# Patient Record
Sex: Female | Born: 1955 | Race: White | Hispanic: No | Marital: Married | Smoking: Former smoker
Health system: Southern US, Community
[De-identification: ages and names within clinical notes are randomized; demographics above are authoritative.]

## PROBLEM LIST (undated history)

## (undated) DIAGNOSIS — F419 Anxiety disorder, unspecified: Secondary | ICD-10-CM

## (undated) DIAGNOSIS — F32A Depression, unspecified: Secondary | ICD-10-CM

## (undated) DIAGNOSIS — F329 Major depressive disorder, single episode, unspecified: Secondary | ICD-10-CM

## (undated) DIAGNOSIS — A879 Viral meningitis, unspecified: Secondary | ICD-10-CM

## (undated) HISTORY — DX: Major depressive disorder, single episode, unspecified: F32.9

## (undated) HISTORY — DX: Depression, unspecified: F32.A

## (undated) HISTORY — DX: Viral meningitis, unspecified: A87.9

## (undated) HISTORY — DX: Anxiety disorder, unspecified: F41.9

---

## 1983-01-03 HISTORY — PX: TUBAL LIGATION: SHX77

## 1990-01-02 HISTORY — PX: VEIN LIGATION: SHX2652

## 1997-05-15 ENCOUNTER — Ambulatory Visit (HOSPITAL_BASED_OUTPATIENT_CLINIC_OR_DEPARTMENT_OTHER): Admission: RE | Admit: 1997-05-15 | Discharge: 1997-05-15 | Payer: Self-pay | Admitting: Surgery

## 1998-03-30 ENCOUNTER — Encounter (INDEPENDENT_AMBULATORY_CARE_PROVIDER_SITE_OTHER): Payer: Self-pay | Admitting: Internal Medicine

## 1998-03-30 ENCOUNTER — Other Ambulatory Visit: Admission: RE | Admit: 1998-03-30 | Discharge: 1998-03-30 | Payer: Self-pay | Admitting: Family Medicine

## 1998-03-30 LAB — CONVERTED CEMR LAB: Pap Smear: NORMAL

## 1998-08-19 ENCOUNTER — Ambulatory Visit (HOSPITAL_COMMUNITY): Admission: RE | Admit: 1998-08-19 | Discharge: 1998-08-19 | Payer: Self-pay | Admitting: Obstetrics and Gynecology

## 1998-08-19 ENCOUNTER — Encounter (INDEPENDENT_AMBULATORY_CARE_PROVIDER_SITE_OTHER): Payer: Self-pay | Admitting: Specialist

## 2004-04-05 ENCOUNTER — Ambulatory Visit: Payer: Self-pay | Admitting: Family Medicine

## 2004-04-12 ENCOUNTER — Ambulatory Visit: Payer: Self-pay | Admitting: Family Medicine

## 2004-04-27 ENCOUNTER — Ambulatory Visit: Payer: Self-pay | Admitting: Family Medicine

## 2004-05-11 ENCOUNTER — Ambulatory Visit: Payer: Self-pay | Admitting: Family Medicine

## 2004-06-09 ENCOUNTER — Ambulatory Visit: Payer: Self-pay | Admitting: Family Medicine

## 2004-10-31 ENCOUNTER — Ambulatory Visit: Payer: Self-pay | Admitting: Family Medicine

## 2004-10-31 ENCOUNTER — Encounter: Admission: RE | Admit: 2004-10-31 | Discharge: 2004-10-31 | Payer: Self-pay | Admitting: Family Medicine

## 2004-12-08 ENCOUNTER — Ambulatory Visit: Payer: Self-pay | Admitting: Family Medicine

## 2005-03-28 ENCOUNTER — Ambulatory Visit: Payer: Self-pay | Admitting: Family Medicine

## 2005-04-18 ENCOUNTER — Ambulatory Visit: Payer: Self-pay | Admitting: Family Medicine

## 2005-05-09 ENCOUNTER — Ambulatory Visit: Payer: Self-pay | Admitting: Family Medicine

## 2005-08-10 ENCOUNTER — Ambulatory Visit: Payer: Self-pay | Admitting: Family Medicine

## 2006-02-06 ENCOUNTER — Ambulatory Visit: Payer: Self-pay | Admitting: Family Medicine

## 2006-07-12 ENCOUNTER — Ambulatory Visit: Payer: Self-pay | Admitting: Family Medicine

## 2006-07-12 DIAGNOSIS — B9789 Other viral agents as the cause of diseases classified elsewhere: Secondary | ICD-10-CM

## 2006-07-31 ENCOUNTER — Encounter: Payer: Self-pay | Admitting: Internal Medicine

## 2006-07-31 DIAGNOSIS — F411 Generalized anxiety disorder: Secondary | ICD-10-CM | POA: Insufficient documentation

## 2006-07-31 DIAGNOSIS — G039 Meningitis, unspecified: Secondary | ICD-10-CM | POA: Insufficient documentation

## 2006-07-31 DIAGNOSIS — F329 Major depressive disorder, single episode, unspecified: Secondary | ICD-10-CM

## 2006-11-22 ENCOUNTER — Ambulatory Visit: Payer: Self-pay | Admitting: Family Medicine

## 2006-11-22 DIAGNOSIS — K209 Esophagitis, unspecified without bleeding: Secondary | ICD-10-CM | POA: Insufficient documentation

## 2006-11-22 DIAGNOSIS — R109 Unspecified abdominal pain: Secondary | ICD-10-CM

## 2007-02-14 ENCOUNTER — Telehealth (INDEPENDENT_AMBULATORY_CARE_PROVIDER_SITE_OTHER): Payer: Self-pay | Admitting: Internal Medicine

## 2007-02-20 ENCOUNTER — Ambulatory Visit: Payer: Self-pay | Admitting: Family Medicine

## 2007-02-20 DIAGNOSIS — R635 Abnormal weight gain: Secondary | ICD-10-CM | POA: Insufficient documentation

## 2007-02-21 LAB — CONVERTED CEMR LAB
ALT: 30 units/L (ref 0–35)
AST: 23 units/L (ref 0–37)
Albumin: 4 g/dL (ref 3.5–5.2)
Alkaline Phosphatase: 73 units/L (ref 39–117)
BUN: 15 mg/dL (ref 6–23)
Basophils Absolute: 0 10*3/uL (ref 0.0–0.1)
Basophils Relative: 0.2 % (ref 0.0–1.0)
Bilirubin, Direct: 0.1 mg/dL (ref 0.0–0.3)
CO2: 28 meq/L (ref 19–32)
Calcium: 9 mg/dL (ref 8.4–10.5)
Chloride: 106 meq/L (ref 96–112)
Cholesterol: 270 mg/dL (ref 0–200)
Creatinine, Ser: 1.1 mg/dL (ref 0.4–1.2)
Direct LDL: 185.4 mg/dL
Eosinophils Absolute: 0.2 10*3/uL (ref 0.0–0.6)
Eosinophils Relative: 3.7 % (ref 0.0–5.0)
GFR calc Af Amer: 67 mL/min
GFR calc non Af Amer: 55 mL/min
Glucose, Bld: 120 mg/dL — ABNORMAL HIGH (ref 70–99)
HCT: 39.7 % (ref 36.0–46.0)
HDL: 38 mg/dL — ABNORMAL LOW (ref >39.0)
Hemoglobin: 13.4 g/dL (ref 12.0–15.0)
Lymphocytes Relative: 28.9 % (ref 12.0–46.0)
MCHC: 33.7 g/dL (ref 30.0–36.0)
MCV: 89.7 fL (ref 78.0–100.0)
Monocytes Absolute: 0.4 10*3/uL (ref 0.2–0.7)
Monocytes Relative: 7.8 % (ref 3.0–11.0)
Neutro Abs: 3.2 10*3/uL (ref 1.4–7.7)
Neutrophils Relative %: 59.4 % (ref 43.0–77.0)
Platelets: 248 10*3/uL (ref 150–400)
Potassium: 4.5 meq/L (ref 3.5–5.1)
RBC: 4.43 M/uL (ref 3.87–5.11)
RDW: 12.7 % (ref 11.5–14.6)
Sodium: 140 meq/L (ref 135–145)
TSH: 1.88 microintl units/mL (ref 0.35–5.50)
Total Bilirubin: 0.9 mg/dL (ref 0.3–1.2)
Total CHOL/HDL Ratio: 7.1
Total Protein: 6.9 g/dL (ref 6.0–8.3)
Triglycerides: 231 mg/dL (ref 0–149)
VLDL: 46 mg/dL — ABNORMAL HIGH (ref 0–40)
WBC: 5.3 10*3/uL (ref 4.5–10.5)

## 2007-03-03 LAB — HM COLONOSCOPY

## 2007-03-15 ENCOUNTER — Telehealth: Payer: Self-pay | Admitting: Family Medicine

## 2007-03-19 ENCOUNTER — Ambulatory Visit: Payer: Self-pay | Admitting: Internal Medicine

## 2007-03-21 ENCOUNTER — Ambulatory Visit (HOSPITAL_COMMUNITY): Admission: RE | Admit: 2007-03-21 | Discharge: 2007-03-21 | Payer: Self-pay | Admitting: Internal Medicine

## 2007-03-28 ENCOUNTER — Encounter (INDEPENDENT_AMBULATORY_CARE_PROVIDER_SITE_OTHER): Payer: Self-pay | Admitting: Internal Medicine

## 2007-03-28 ENCOUNTER — Encounter: Payer: Self-pay | Admitting: Internal Medicine

## 2007-03-28 ENCOUNTER — Ambulatory Visit: Payer: Self-pay | Admitting: Internal Medicine

## 2007-03-28 DIAGNOSIS — D126 Benign neoplasm of colon, unspecified: Secondary | ICD-10-CM | POA: Insufficient documentation

## 2007-03-28 DIAGNOSIS — K5730 Diverticulosis of large intestine without perforation or abscess without bleeding: Secondary | ICD-10-CM | POA: Insufficient documentation

## 2007-04-29 ENCOUNTER — Ambulatory Visit (HOSPITAL_COMMUNITY): Admission: RE | Admit: 2007-04-29 | Discharge: 2007-04-30 | Payer: Self-pay | Admitting: General Surgery

## 2007-04-29 ENCOUNTER — Encounter (INDEPENDENT_AMBULATORY_CARE_PROVIDER_SITE_OTHER): Payer: Self-pay | Admitting: General Surgery

## 2007-05-20 ENCOUNTER — Telehealth (INDEPENDENT_AMBULATORY_CARE_PROVIDER_SITE_OTHER): Payer: Self-pay | Admitting: Internal Medicine

## 2007-07-10 ENCOUNTER — Telehealth (INDEPENDENT_AMBULATORY_CARE_PROVIDER_SITE_OTHER): Payer: Self-pay | Admitting: Internal Medicine

## 2007-08-12 ENCOUNTER — Ambulatory Visit: Payer: Self-pay | Admitting: Family Medicine

## 2007-08-12 DIAGNOSIS — E7800 Pure hypercholesterolemia, unspecified: Secondary | ICD-10-CM | POA: Insufficient documentation

## 2007-10-04 ENCOUNTER — Ambulatory Visit: Payer: Self-pay | Admitting: Family Medicine

## 2007-10-10 LAB — CONVERTED CEMR LAB
ALT: 34 units/L (ref 0–35)
AST: 27 units/L (ref 0–37)
Cholesterol: 178 mg/dL (ref 0–200)
HDL: 39.6 mg/dL (ref >39.0)
LDL Cholesterol: 114 mg/dL — ABNORMAL HIGH (ref 0–99)
Total CHOL/HDL Ratio: 4.5
Triglycerides: 123 mg/dL (ref 0–149)
VLDL: 25 mg/dL (ref 0–40)

## 2007-10-17 ENCOUNTER — Telehealth (INDEPENDENT_AMBULATORY_CARE_PROVIDER_SITE_OTHER): Payer: Self-pay | Admitting: Internal Medicine

## 2007-12-17 ENCOUNTER — Encounter (INDEPENDENT_AMBULATORY_CARE_PROVIDER_SITE_OTHER): Payer: Self-pay | Admitting: *Deleted

## 2008-01-15 ENCOUNTER — Telehealth (INDEPENDENT_AMBULATORY_CARE_PROVIDER_SITE_OTHER): Payer: Self-pay | Admitting: Internal Medicine

## 2008-01-16 ENCOUNTER — Telehealth (INDEPENDENT_AMBULATORY_CARE_PROVIDER_SITE_OTHER): Payer: Self-pay | Admitting: Internal Medicine

## 2008-04-07 ENCOUNTER — Encounter (INDEPENDENT_AMBULATORY_CARE_PROVIDER_SITE_OTHER): Payer: Self-pay | Admitting: *Deleted

## 2008-04-17 ENCOUNTER — Telehealth (INDEPENDENT_AMBULATORY_CARE_PROVIDER_SITE_OTHER): Payer: Self-pay | Admitting: Internal Medicine

## 2009-06-03 ENCOUNTER — Ambulatory Visit: Payer: Self-pay | Admitting: Family Medicine

## 2009-06-03 DIAGNOSIS — K5289 Other specified noninfective gastroenteritis and colitis: Secondary | ICD-10-CM

## 2010-01-23 ENCOUNTER — Encounter: Payer: Self-pay | Admitting: Internal Medicine

## 2010-02-01 NOTE — Assessment & Plan Note (Signed)
Summary: ACU FOR VOMITING, CANT KEEP ANYTHING DOWN   Vital Signs:  Patient profile:   55 year old female Weight:      228.75 pounds Temp:     97.8 degrees F oral Pulse rate:   76 / minute Pulse rhythm:   regular BP sitting:   118 / 62  (left arm) Cuff size:   large  Vitals Entered By: Sydell Axon LPN (June 04, 3014 8:52 AM) CC: Vomitting X 2 days, and aches all over   History of Present Illness: Pt feels awful. She can't keep anything in her stomach, hurts all over, diarrhea not often. She has not been travelled at all, no suspoicious food. Husband had been sick all weekend with same sxs and was treated with Zpak for hurting all over with shakes and chills. She has chills and shakes and has nausea on top of her sxs that he has had. Her head hurts significantly with coughing at the base of the neck. He had congestion, she does not have that. She has not been exposed to anything that is suspicious. She has been minimally dizzy but with "crawly" skin.  Problems Prior to Update: 1)  Gastroenteritis  (ICD-558.9) 2)  Colonic Polyps, Benign  (ICD-211.3) 3)  Diverticulosis of Colon  (ICD-562.10) 4)  Pure Hypercholesterolemia  (ICD-272.0) 5)  Weight Gain  (ICD-783.1) 6)  Health Screening  (ICD-V70.0) 7)  Gerd  (ICD-530.1) 8)  Abdominal Pain Other Specified Site  (ICD-789.09) 9)  Meningitis Nos  (ICD-322.9) 10)  Anxiety  (ICD-300.00) 11)  Depression  (ICD-311) 12)  Infection, Viral Nos  (ICD-079.99)  Medications Prior to Update: 1)  Gnp Glucosamine Chondroitin   Tabs (Misc Natural Products) .... Once Daily 2)  Vit C,e,a,b Complex .... Once Daily 3)  Nasacort Aq 55 Mcg/act  Aers (Triamcinolone Acetonide(Nasal)) .... 2 Each Nostril Qd 4)  Zocor 40 Mg  Tabs (Simvastatin) .Marland Kitchen.. 1 Once Daily For Cholesterol By Mouth 5)  Prozac 20 Mg  Caps (Fluoxetine Hcl) .Marland Kitchen.. 1 Once Daily For Anxiety By Mouth 6)  Wellbutrin Xl 300 Mg Xr24h-Tab (Bupropion Hcl) .Marland Kitchen.. 1 Once Daily For Depression By  Mouth  Allergies: No Known Drug Allergies  Physical Exam  General:  WDWn in mild distress from generally feeling poorly and being nauseated. Nontoxic but mildly dry appearing. Head:  normocephalic, atraumatic, and no abnormalities observed.  Sinuses NT. Eyes:  Conjunctiva clear bilaterally and moist. Ears:  External ear exam shows no significant lesions or deformities.  Otoscopic examination reveals clear canals, tympanic membranes are intact bilaterally without bulging, retraction, inflammation or discharge. Hearing is grossly normal bilaterally. Nose:  External nasal examination shows no deformity or inflammation. Nasal mucosa are pink and moist without lesions or exudates. Mouth:  Oral mucosa and oropharynx without lesions or exudates.  Teeth in good repair. MM minimally dry. Neck:  no thyromegaly, no JVD, and no carotid bruits.   Lungs:  normal respiratory effort, no intercostal retractions, no accessory muscle use, and normal breath sounds.   Heart:  normal rate, regular rhythm, and no murmur.     Impression & Recommendations:  Problem # 1:  GASTROENTERITIS (ICD-558.9) Assessment New  Do not think the pt has bronchitis like her husband had. She is acting and examinatioon concurs looks more like viral syndrome affecting her bowels more tha UR system.  Phenergan 50mg  IM now and then every 4 hrs augment with 25mg  today, tomm go to every 6 hrs and then as needed.  Go to clears today  aand tomm if needed. Then adavance to BRAT.  Avoid milk and milk products for 1+ week.  Call if no better. Will check on her. When able, start tyl regularly.  Discussed use of medication and role of diet. Encouraged clear liquids and electrolyte replacement fluids. Instructed to call if any signs of worsening dehydration.   Orders: Promethazine up to 50mg  (J2550) Admin of Therapeutic Inj  intramuscular or subcutaneous (16109)  Complete Medication List: 1)  Vit C,e,a,b Complex  .... Once daily 2)   Nasacort Aq 55 Mcg/act Aers (Triamcinolone acetonide(nasal)) .... 2 each nostril qd 3)  Promethazine Hcl 25 Mg Tabs (Promethazine hcl) .... One tab by mouth every 6 hrs as needed nausea 4)  Promethazine Hcl 25 Mg Supp (Promethazine hcl) .... One supp pr every 6 hrs as needed nausea.  Patient Instructions: 1)  spent with pt. 2)  Establish when able with Dr Para March. Prescriptions: PROMETHAZINE HCL 25 MG SUPP (PROMETHAZINE HCL) one supp pr every 6 hrs as needed nausea.  #5 x 0   Entered and Authorized by:   Shaune Leeks MD   Signed by:   Shaune Leeks MD on 06/03/2009   Method used:   Electronically to        [facility]* (retail)       [address]       [place], [place]  [address]       Ph: [phone]       Fax: [phone]   RxID:   [id number] PROMETHAZINE HCL 25 MG TABS (PROMETHAZINE HCL) one tab by mouth every 6 hrs as needed nausea  #20 x 0   Entered and Authorized by:   Shaune Leeks MD   Signed by:   Shaune Leeks MD on 06/03/2009   Method used:   Electronically to        [facility]* (retail)       [address]       [place], [place]  [address]       Ph: [phone]       Fax: [phone]   RxID:   [id number]   Current Allergies (reviewed today): No known allergies    Medication Administration  Injection # 1:    Medication: Promethazine up to 50mg     Diagnosis: GASTROENTERITIS (ICD-558.9)    Route: IM    Site: LUOQ gluteus    Exp Date: 05/03/2010    Lot #: [id number]    Mfr: NOVAPLUS    Comments: 50 mg given as instructed.    Patient tolerated injection without complications    Given by: Sydell Axon LPN (June 03, 6008 9:40 AM)  Orders Added: 1)  Est. Patient Level IV [93235] 2)  Promethazine up to 50mg  [J2550] 3)  Admin of Therapeutic Inj  intramuscular or subcutaneous [57322]

## 2010-02-11 IMAGING — US US ABDOMEN COMPLETE
1 series · 13 of 25 positions shown · non-contrast
Comparison: Plain film 10/31/2004

CLINICAL DATA: RUQ ABD PAIN NAUSEA;

ABDOMEN ULTRASOUND
TECHNIQUE: Complete abdominal ultrasound examination was performed
including evaluation of the liver, gallbladder, bile ducts,
pancreas, kidneys, spleen, IVC, and abdominal aorta.

[Series 1: unknown · 0.41mm/px · 13 of 64 slices shown]
[im 1/64]
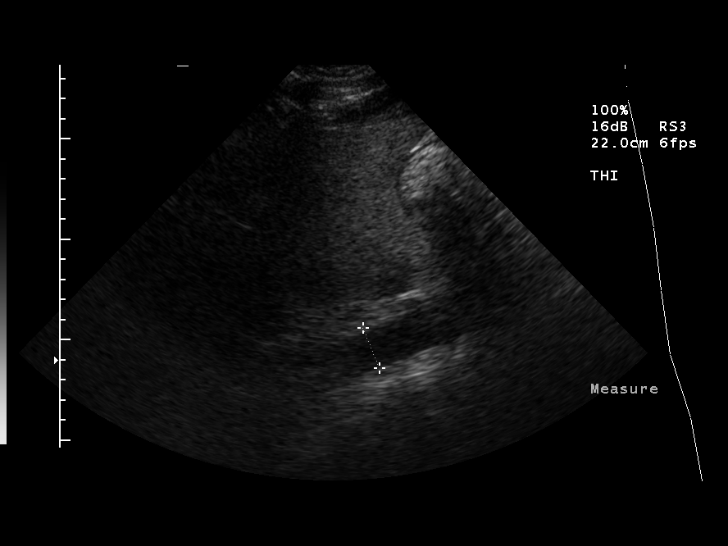
[im 6/64]
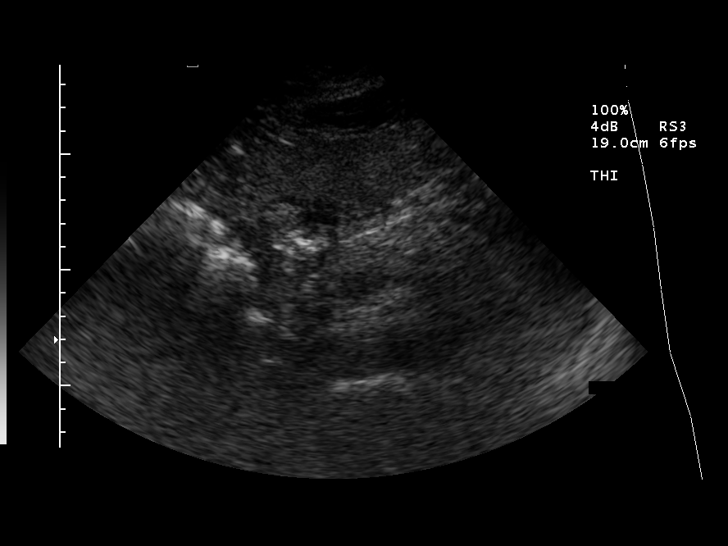
[im 11/64]
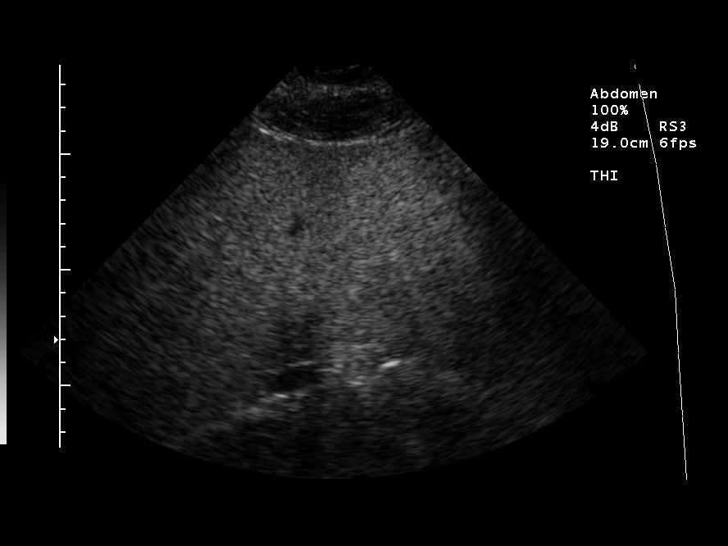
[im 16/64]
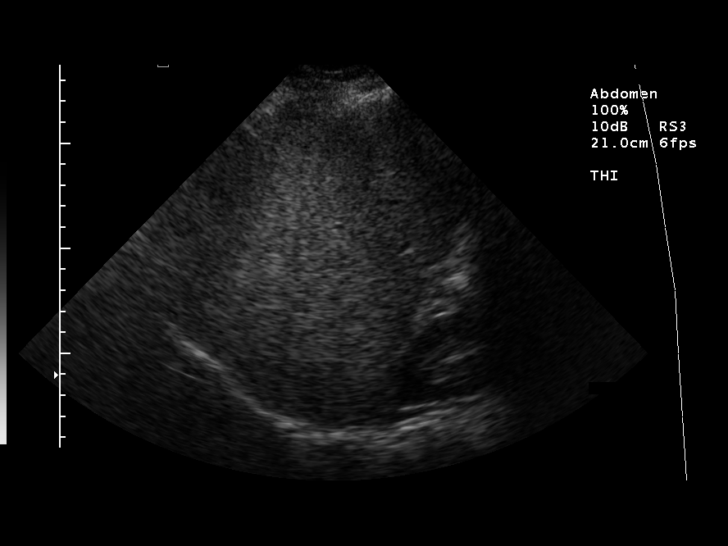
[im 22/64]
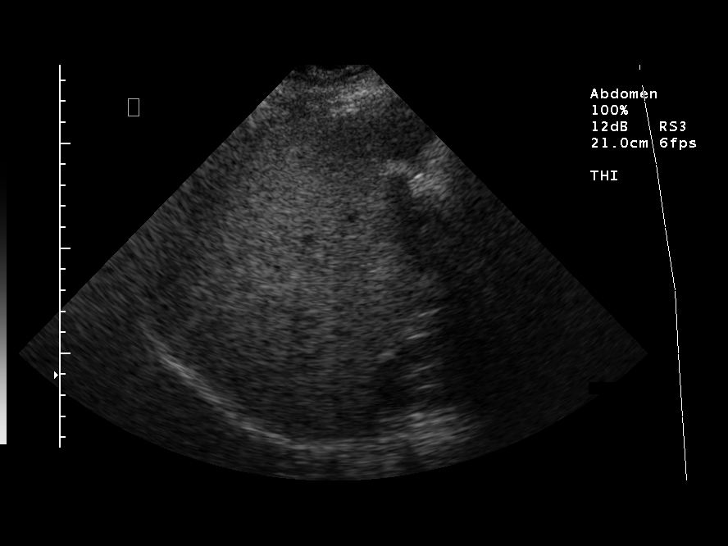
[im 27/64]
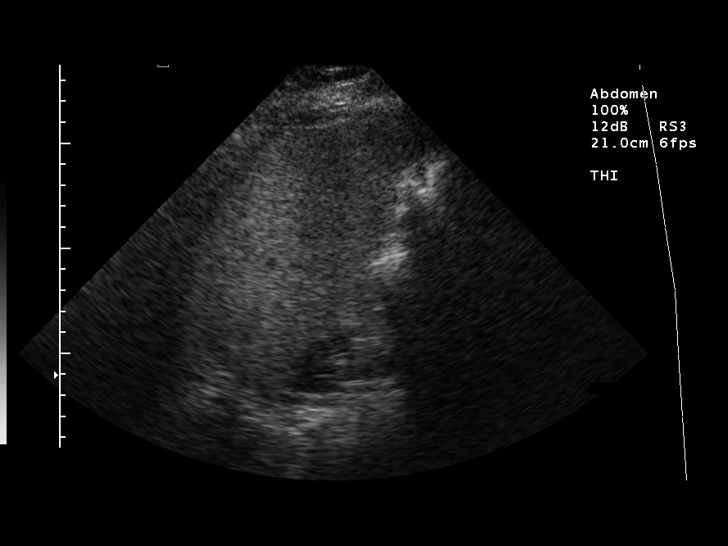
[im 32/64]
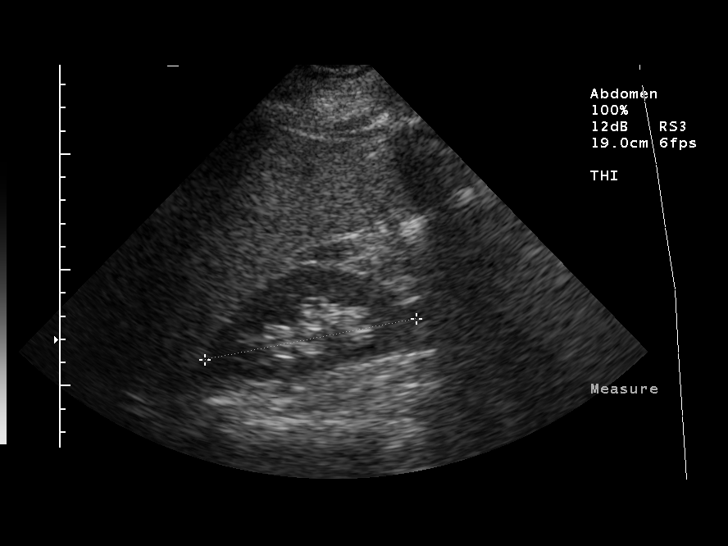
[im 37/64]
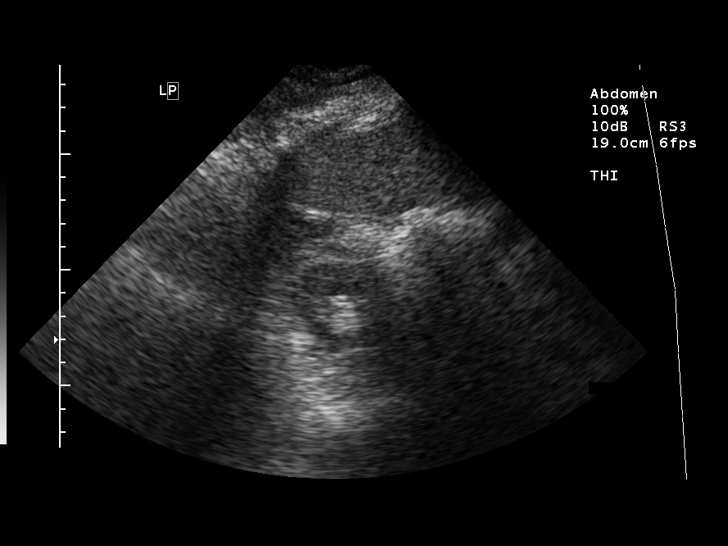
[im 43/64]
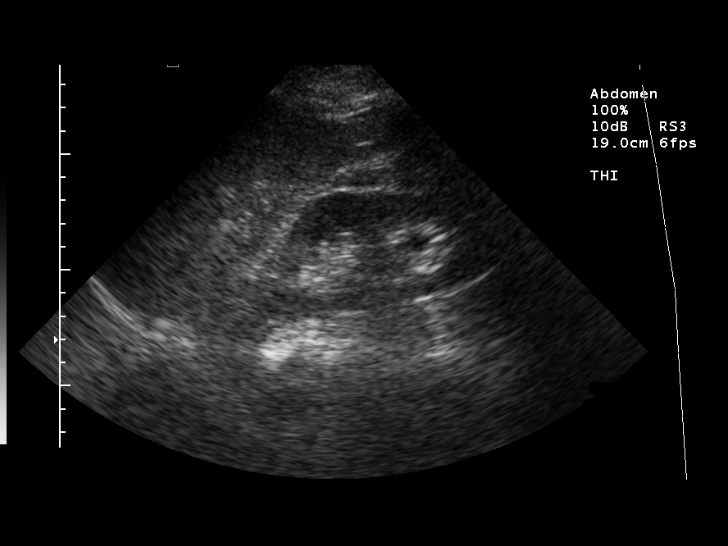
[im 48/64]
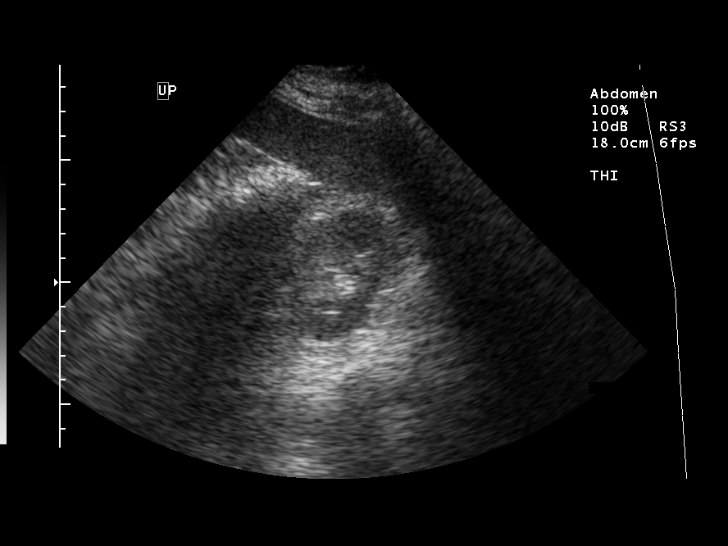
[im 53/64]
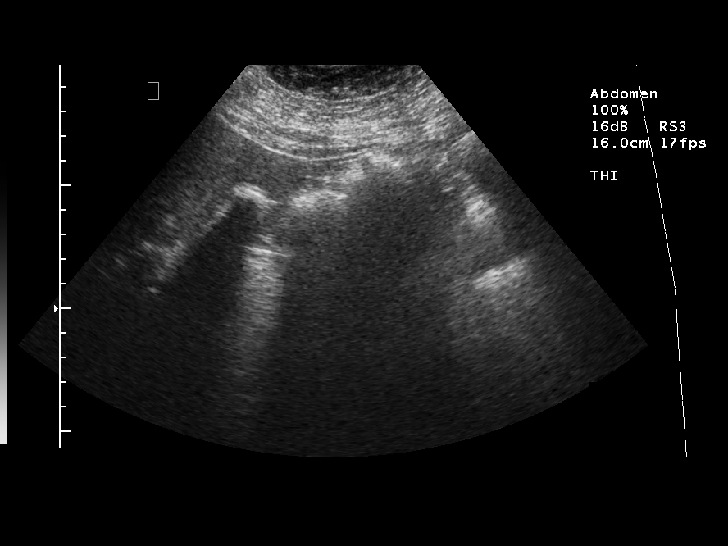
[im 58/64]
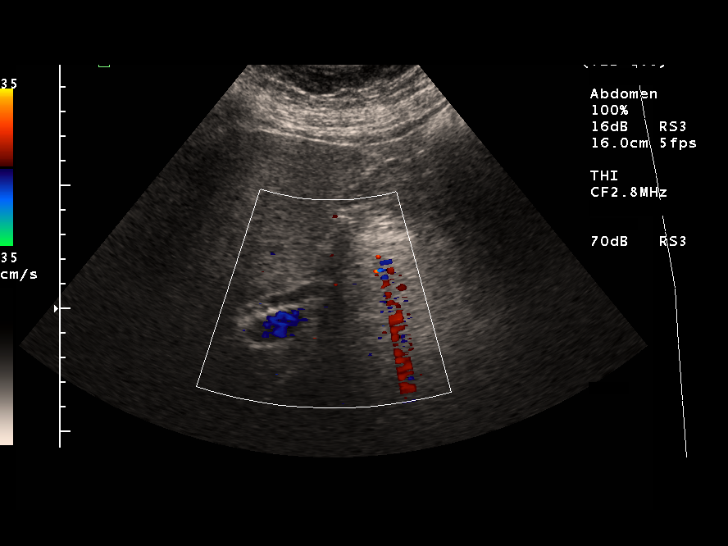
[im 64/64]
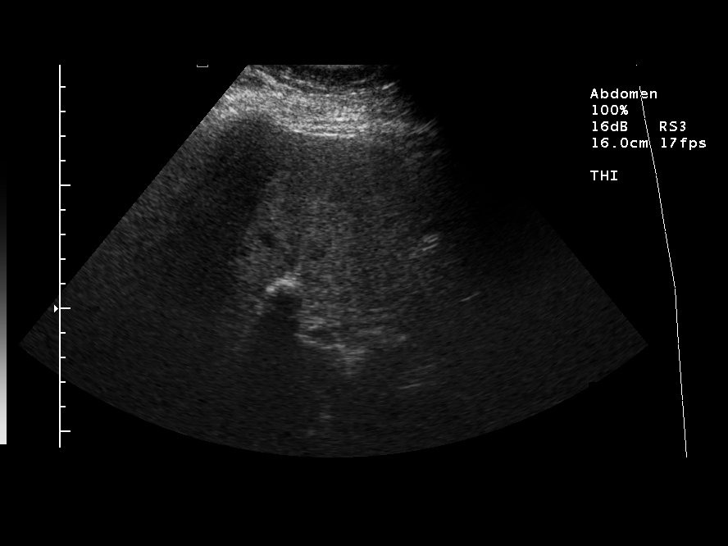

[13 of 25 positions shown; findings below may reference images not displayed]

FINDINGS: Within the gallbladder fossa, there is dense echogenicity
with posterior acoustic shadowing.  Sonographic Murphy's sign was
not elicited.

Common duct is borderline/mildly dilated for age at 8 mm.  No
intrahepatic biliary ductal dilatation.

IVC, pancreas all within normal limits.

Increased echogenicity of the liver most consistent with fatty
infiltration.

Spleen normal in size and echotexture.

Right kidney 9.3 cm and left kidney 10.8 cm.  No hydronephrosis.

Abdominal aorta nonaneurysmal without ascites.]
IMPRESSION: 1.  Echogenic shadowing focus within the gallbladder fossa.  Most
likely a stone filled contracted gallbladder suspicious for chronic
cholecystitis.  Alternatively, calcified gallbladder wall
(porcelain gallbladder) could have this appearance.  If
differentiation is desired, CT may be informative.
2.  Increased echogenicity of the liver most consistent with fatty
infiltration.
3.  Borderline/mild common duct dilatation for age without
intrahepatic biliary ductal dilatation.  Consider correlation with
bilirubin levels.

## 2010-05-17 NOTE — Op Note (Signed)
NAMEJANELE, Alison Chung               ACCOUNT NO.:  [id number]   MEDICAL RECORD NO.:  [id number]          PATIENT TYPE:  OIB   LOCATION:  5127                         FACILITY:  [facility]   PHYSICIAN:  Ollen Gross. Vernell Morgans, M.D. DATE OF BIRTH:  1955-09-19   DATE OF PROCEDURE:  04/29/2007  DATE OF DISCHARGE:                               OPERATIVE REPORT   PREOPERATIVE DIAGNOSIS:  Cholecystitis with cholelithiasis.   POSTOPERATIVE DIAGNOSIS:  Cholecystitis with cholelithiasis.   PROCEDURES:  Laparoscopic cholecystectomy with intraoperative  cholangiogram.   SURGEON:  Ollen Gross. Vernell Morgans, MD   ASSISTANT:  Amber L. Freida Busman, MD   ANESTHESIA:  General endotracheal.   PROCEDURE:  After informed consent was obtained, the patient was brought  to the operating room, placed in a supine position on the table.  After  adequate induction of general anesthesia, the patient's abdomen was  prepped with Betadine and draped in usual sterile manner.  The area  above the umbilicus was infiltrated with 0.25% Marcaine.  A small  incision was made with a 15-blade knife.  This incision was carried down  through the subcutaneous tissue bluntly with a hemostat and [employer]  retractors until the linea was identified.  The linea was incised with a  15-blade knife and each side was grasped with Kocher clamps and elevated  anteriorly.  The preperitoneal space was then probed bluntly with a  hemostat until the peritoneum was opened and access was gained to the  abdominal cavity.  A 0 Vicryl pursestring stitch was placed in the  fascia around the opening.  A Hasson cannula was placed through the  opening and anchored in place at the previous with 0 Vicryl pursestring  stitch.  The abdomen was then insufflated with carbon dioxide without  difficulty.  The patient was placed in a reverse Trendelenburg position  and rotated with the right side up.  The laparoscope was inserted  through the Hasson cannula, and the right upper  quadrant was inspected.  The patient had a fairly large fatty liver.  The epigastric region was  then infiltrated with 0.25% Marcaine.  A small incision was made with a  15-blade knife and a 10-mm port was placed bluntly through this incision  into the abdominal cavity under direct vision.  Sites were then chosen  laterally on the right side of the abdomen with placement of 5-mm ports.  Each of these areas were infiltrated with 0.25% Marcaine.  Small stab  incisions were made with a 15-blade knife and 5-mm ports were placed  bluntly through these incisions into the abdominal cavity under direct  vision.  A blunt grasper was placed through the lateral most 5-mm port  and used to grasp the dome of the gallbladder and elevated anteriorly  and superiorly.  Another blunt grasper was placed through the other 5-mm  port and used to retract on the body and neck of the gallbladder.  Dissector was placed through the epigastric port and using  electrocautery, the peritoneal reflection at the gallbladder neck area  was opened.  Blunt dissection was then carried out in this  area until  the gallbladder neck cystic duct junction was readily identified and a  good window was created.  There was a lot of chronic inflammation in  this area, and this portion of the case took a lot of very meticulous  tedious blunt dissection until the junction of the gallbladder and the  cystic duct was truly identified.  The cystic artery was also identified  right next to the duct, and it was again dissected bluntly in a  circumferential manner until a good window was created.  Two clips  placed proximally and distally on the artery, and artery was divided  into 2.  Next, a clip was placed on the gallbladder on the gallbladder  neck.  A small ductotomy was made just below the clip.  A 14-gauge  Angiocath was then placed percutaneously through the anterior abdominal  wall under direct vision.  A Reddick cholangiogram  catheter was placed  through the Angiocath and flushed.  The Reddick catheter was then placed  within the cystic duct and anchored in place with the clip.  Cholangiogram was obtained that showed no filling defects, good emptying  in the duodenum.  The entire duct system was a bit dilated, but no  stones were identified in the common duct system, and the cystic duct  was relatively short.  At this point, the anchoring clip and catheters  were removed from the patient.  Two clips were placed proximally on the  cystic duct, and the duct was divided between the 2 sets of clips.  Next, a laparoscopic hook cautery device was used to separate the  gallbladder from liver bed.  A small posterior arterial branch was also  identified, dissected bluntly in a circumferential manner until a good  window was created and then a clip was placed across it.  It was also  divided with the electrocautery prior to completely detaching the  gallbladder from the liver bed.  The liver bed was inspected and several  small bleeding points were coagulated with electrocautery until the area  was completely hemostatic.  There was also a couple of small tears of  liver capsule in this just lateral and medial to the liver bed that was  also cauterized and completely hemostatic.  Once this was accomplished,  the gallbladder was then able to be detached away from liver bed without  difficulty with hook electrocautery.  A laparoscopic bag was inserted  through the epigastric port.  The gallbladder was placed in the bag and  the bag was sealed.  The abdomen was irrigated with copious amounts of  saline to the effluent was clear.  Large piece of Surgicel was also  placed along the gallbladder bed of the liver.  The laparoscope was then  moved to the epigastric port.  The gallbladder grasper was placed  through the Hasson cannula used to grasp open the bag.  The bag with the  gallbladder was removed through the supraumbilical  port without  difficulty.  The fascial defect was closed with previously stitched  Vicryl purse-string stitch as well as another figure-of-eight 0 Vicryl  stitch.  The rest of the ports were removed under direct vision and  found to be hemostatic.  The gas was allowed to escape.  The skin  incisions were all closed with interrupted 4-0 Monocryl subcuticular  stitches.  Dermabond dressings were applied.  The patient tolerated the  procedure well.  At the end of the case, all needle, sponge, and  instrument counts  were correct.  The patient was then awakened and taken  to the recovery room in stable condition.      Ollen Gross. Vernell Morgans, M.D.  Electronically Signed     PST/MEDQ  D:  04/29/2007  T:  04/30/2007  Job:  [id number]

## 2010-05-17 NOTE — Assessment & Plan Note (Signed)
[facility]                         GASTROENTEROLOGY OFFICE NOTE   JEZABELLE, CHISOLM                      MRN:          [id number]  DATE:03/19/2007                            DOB:          1955/11/27    REFERRING PHYSICIAN:  Willaim Sheng D. Bean, FNP   REQUESTING Divon Krabill:  Atha Starks. Jillyn Hidden, FNP  Arta Silence, MD)   REASON FOR CONSULTATION:  Right upper quadrant pain.   ASSESSMENT:  A 55 year old white woman who has had some intermittent  right upper quadrant pain, perhaps worse over time.  It is actually  associated with some diarrhea symptoms.  She has known gallstones found  on a lumbar spine film in 2006.  Although these symptoms are atypical, I  do think it is possible that she is having biliary pain.  The diarrhea  is a bit atypical, but again sometimes people have these type of  problems associated with gallbladder disease.  She is tender in her  right upper quadrant and I think chronic cholecystitis is certainly  possible.   RECOMMENDATIONS AND PLAN:  1. Abdominal ultrasound.  2. Surgical consultation with [facility] regarding      cholelithiasis.  3. Schedule colonoscopy to evaluate the diarrhea, plus she is over 50,      but she is having diarrhea and that is the primary cause.   Risks, benefits, and indications of the procedure is explained to the  patient, who understands and agrees to proceed.   HISTORY:  This is a 55 year old white woman who describes some  intermittent, and then more progressive right upper quadrant pain,  somewhat nagging.  It can occur after meals.  There is no infrascapular  pain and the pain is not really severe, but it certainly bothers her.  She has a fairly constant nausea.  She will often have diarrhea after  meals.  This does seem to be progressively worse.  There is no classic  heartburn, indigestion, or dysphagia.  She has not had any rectal  bleeding.  The diarrhea consists of  frequent loose stools 2-3 days out  of the week.   MEDICATIONS:  Wellbutrin, Cymbalta, vitamin B complex, Aleve p.r.n.   ALLERGIES:  THERE ARE NO KNOWN DRUG ALLERGIES.   PAST MEDICAL HISTORY:  Includes depression and anxiety, degenerative  joint disease.   PAST SURGICAL HISTORY:  Tubal ligation and breast surgery.   SOCIAL HISTORY:  The patient is married.  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  She was laid off from [employer] and  [employer] was a Nurse, adult.  Her benefits run out from there at  the end of April.  She is a smoker, social alcohol use.  One son, 1  daughter.   FAMILY HISTORY:  Mother had ovarian cancer.  Father had some sort of  unknown bleeding disorder or problem, though he was an alcoholic and he  had liver disease, so that seems likely to be related to than.   REVIEW OF SYSTEMS:  Eye glasses, night sweats.  Last menstrual period  January 2008.  All other systems  are negative at this time.   PHYSICAL EXAMINATION:  Height 5 feet 9 inches.  Weight 233 pounds.  Blood pressure 120/80.  Pulse 68.  This is an obese, pleasant, white woman in no acute distress.  The eyes  are anicteric.  ENT:  Normal mouth, posterior pharynx.  NECK:  Supple, no thyromegaly or masses.  CHEST:  Clear, resonant.  HEART:  S1 and S2, no murmurs or gallops.  ABDOMEN :  The abdomen has some mild to moderate tenderness in the right  upper quadrant, without an obvious focal Murphy's sign.  The ribs are  not tender.  There is no musculoskeletal tenderness in the abdominal  wall.  The remainder of the abdomen is soft, nontender, without  organomegaly or mass.  Bowel sounds are present.  There are no bruits.  RECTAL:  Deferred until the time of colonoscopy.  LYMPHATIC:  No neck or supraclavicular nodes.  EXTREMITIES:  No peripheral edema.  SKIN:  Warm, dry, no acute rash.  PSYCHIATRIC:  She is alert and oriented x3.  Appropriate affect and  mood.  NEUROLOGIC:   Cranial nerves 2-12 are intact.   I appreciate the opportunity to care for this patient.     Iva Boop, MD,FACG  Electronically Signed    CEG/MedQ  DD: 03/19/2007  DT: 03/20/2007  Job #: [id number]   cc:   Willaim Sheng D. Jillyn Hidden, FNP  [facility]

## 2010-05-17 NOTE — Assessment & Plan Note (Signed)
[facility]                         GASTROENTEROLOGY OFFICE NOTE   JEILY, GUTHRIDGE                      MRN:          [id number]  DATE:03/19/2007                            DOB:          17-Jul-1955    ADDENDUM:  She did have laboratory testing February 20, 2007.  This was  reviewed.  Normal CBC.  Her CMET was normal including LFTs, except for a  glucose of 120.  TSH normal.  Lipids with cholesterol 185, LDL  cholesterol 270, triglycerides 231, HDL 38, VLDL 46.  CBC was normal.     Iva Boop, MD,FACG  Electronically Signed    CEG/MedQ  DD: 03/19/2007  DT: 03/20/2007  Job #: [id number]   cc:   Billie D. Jillyn Hidden, FNP  [facility]

## 2010-09-27 LAB — CBC
HCT: 41.6
Hemoglobin: 14.2
MCHC: 34.2
MCV: 89.1
Platelets: 274
RBC: 4.67
RDW: 13.6
WBC: 7.7

## 2011-10-20 ENCOUNTER — Encounter: Payer: Self-pay | Admitting: Family Medicine

## 2011-10-20 ENCOUNTER — Ambulatory Visit (INDEPENDENT_AMBULATORY_CARE_PROVIDER_SITE_OTHER): Payer: Self-pay | Admitting: Family Medicine

## 2011-10-20 VITALS — BP 102/80 | HR 92 | Temp 97.9°F | Wt 233.0 lb

## 2011-10-20 DIAGNOSIS — K529 Noninfective gastroenteritis and colitis, unspecified: Secondary | ICD-10-CM

## 2011-10-20 DIAGNOSIS — R1110 Vomiting, unspecified: Secondary | ICD-10-CM

## 2011-10-20 DIAGNOSIS — K5289 Other specified noninfective gastroenteritis and colitis: Secondary | ICD-10-CM

## 2011-10-20 MED ORDER — PROMETHAZINE HCL 25 MG PO TABS: 25.0000 mg | ORAL_TABLET | Freq: Four times a day (QID) | ORAL | Status: DC | PRN

## 2011-10-20 MED ORDER — PROMETHAZINE HCL 25 MG/ML IJ SOLN
25.0000 mg | Freq: Once | INTRAMUSCULAR | Status: AC
  Administered 2011-10-20: 25 mg via INTRAMUSCULAR

## 2011-10-20 NOTE — Patient Instructions (Addendum)
Take promethazine 25mg  every 6 hours and sip fluids or take ice chips.  Avoid dairy for about 1 week.  If you stop making urine, then go to the hospital.  Take care.

## 2011-10-20 NOTE — Progress Notes (Signed)
Had a toothache last week, followed by head ache and back ache.  Then she started vomiting over the last 3 days.  Last UOP this AM but less volume and darker than typical.  She gets nauseated with solids or more than a few sips of liquids.  Abdominal wall sore from gagging.  "I ache like I had the flu."  No fevers on temp checks, but had sweats/chills.  No blood in vomit.  Yellow diarrhea recently.   Meds, vitals, and allergies reviewed.   ROS: See HPI.  Otherwise, noncontributory.  nad but looks like she doesn't feel well ncat  Tm wnl Nasal exam wnl OP wnl except for mildly dry membranes Neck supple, no LA rrr ctab abd soft, not ttp, normal BS Ext w/o edema; well perfused.

## 2011-10-21 DIAGNOSIS — K529 Noninfective gastroenteritis and colitis, unspecified: Secondary | ICD-10-CM | POA: Insufficient documentation

## 2011-10-21 NOTE — Assessment & Plan Note (Signed)
Likely viral AGE, nontoxic but will need to inc fluids as tolerated.  Given phenergan IM here in clinic with rx for oral phenergan.  Should be able to tolerate this and inc PO fluids as outpatient.  Avoid dairy.  Doesn't appear to need IVF but if UOP drops off, will need ER eval.  She agrees.  Has husband to help her at home.  Okay for outpatient f/u.

## 2014-02-19 ENCOUNTER — Ambulatory Visit (INDEPENDENT_AMBULATORY_CARE_PROVIDER_SITE_OTHER): Payer: BLUE CROSS/BLUE SHIELD | Admitting: Family Medicine

## 2014-02-19 ENCOUNTER — Encounter: Payer: Self-pay | Admitting: Family Medicine

## 2014-02-19 VITALS — BP 122/82 | HR 83 | Temp 98.4°F | Wt 227.5 lb

## 2014-02-19 DIAGNOSIS — H00019 Hordeolum externum unspecified eye, unspecified eyelid: Secondary | ICD-10-CM | POA: Insufficient documentation

## 2014-02-19 DIAGNOSIS — H00013 Hordeolum externum right eye, unspecified eyelid: Secondary | ICD-10-CM

## 2014-02-19 DIAGNOSIS — L719 Rosacea, unspecified: Secondary | ICD-10-CM

## 2014-02-19 MED ORDER — METRONIDAZOLE 1 % EX GEL: Freq: Every day | CUTANEOUS | Status: DC

## 2014-02-19 NOTE — Assessment & Plan Note (Signed)
Much improved today per patient.  Continue topical tx with warm compresses.  Should resolve, likely incidental to the other lesions that are likely rosacea.

## 2014-02-19 NOTE — Patient Instructions (Signed)
Use the gel on your face but not in your eyes.   Keep using warm compresses for the stye.   If not better, we can refer you to the eye clinic.  Take care.

## 2014-02-19 NOTE — Progress Notes (Signed)
Pre visit review using our clinic review tool, if applicable. No additional management support is needed unless otherwise documented below in the visit note.  She is working to stop smoking.  She is down to <1/2 PPD.   R eyelid lesion.  Stye started about 2 weeks ago.  She had some crusting at the time.  The stye improved, but then she had more upper eyelid swelling again.  Using warm compresses.  No lower eyelid swelling.  No primary eye sx.  Her R upper eyelid is much improved with warm compresses.    She has FH rosacea, mother in later years.  If patient uses facial cream, it makes her face break out.  She again had a facial break out, but w/o a clear trigger.    Meds, vitals, and allergies reviewed.   ROS: See HPI.  Otherwise, noncontributory.  nad ncat Tm wnl nasal and OP exam wnl Mmm PERRL, EOMI, no FB in the eyes. Conjunctiva wnl B Lids wnl except for small stye on R upper eyelid, not ttp Small papules noted on the B face, inferior to B eyes, just inferior to eyebrows

## 2014-02-19 NOTE — Assessment & Plan Note (Signed)
likely rosacea, d/w pt.  Would try topical metrogel and call back as needed.  She agrees.

## 2014-02-20 ENCOUNTER — Telehealth: Payer: Self-pay | Admitting: *Deleted

## 2014-02-20 ENCOUNTER — Telehealth: Payer: Self-pay | Admitting: Family Medicine

## 2014-02-20 MED ORDER — DOXYCYCLINE HYCLATE 100 MG PO TABS: 100.0000 mg | ORAL_TABLET | Freq: Every day | ORAL | Status: DC

## 2014-02-20 NOTE — Telephone Encounter (Signed)
Left detailed message on voicemail at home.

## 2014-02-20 NOTE — Telephone Encounter (Signed)
emmi emailed °

## 2014-02-20 NOTE — Telephone Encounter (Signed)
Can try doxy once a day, short course to try.  rx sent.  Update [place] after she has tried the medicine.  Metronidazole wasn't always that expensive.  I wasn't expecting that price.  Thanks.

## 2014-02-20 NOTE — Telephone Encounter (Signed)
Faxed Correspondence from [facility].  Patient was prescribed Metronidazole.  It is over $300 on her insurance.  Is there a lower cost alternative you could prescribe?

## 2014-04-09 ENCOUNTER — Emergency Department (HOSPITAL_COMMUNITY): Payer: BLUE CROSS/BLUE SHIELD

## 2014-04-09 ENCOUNTER — Encounter (HOSPITAL_COMMUNITY): Payer: Self-pay | Admitting: Emergency Medicine

## 2014-04-09 ENCOUNTER — Emergency Department (HOSPITAL_COMMUNITY)
Admission: EM | Admit: 2014-04-09 | Discharge: 2014-04-09 | Discharge status: Against Medical Advice | Payer: BLUE CROSS/BLUE SHIELD | Attending: Emergency Medicine | Admitting: Emergency Medicine

## 2014-04-09 DIAGNOSIS — Z7982 Long term (current) use of aspirin: Secondary | ICD-10-CM | POA: Diagnosis not present

## 2014-04-09 DIAGNOSIS — Z791 Long term (current) use of non-steroidal anti-inflammatories (NSAID): Secondary | ICD-10-CM | POA: Diagnosis not present

## 2014-04-09 DIAGNOSIS — Z8619 Personal history of other infectious and parasitic diseases: Secondary | ICD-10-CM | POA: Diagnosis not present

## 2014-04-09 DIAGNOSIS — Z792 Long term (current) use of antibiotics: Secondary | ICD-10-CM | POA: Insufficient documentation

## 2014-04-09 DIAGNOSIS — Z720 Tobacco use: Secondary | ICD-10-CM | POA: Insufficient documentation

## 2014-04-09 DIAGNOSIS — R079 Chest pain, unspecified: Secondary | ICD-10-CM | POA: Insufficient documentation

## 2014-04-09 DIAGNOSIS — R55 Syncope and collapse: Secondary | ICD-10-CM | POA: Diagnosis present

## 2014-04-09 DIAGNOSIS — R0602 Shortness of breath: Secondary | ICD-10-CM | POA: Diagnosis not present

## 2014-04-09 DIAGNOSIS — Z8659 Personal history of other mental and behavioral disorders: Secondary | ICD-10-CM | POA: Insufficient documentation

## 2014-04-09 LAB — BASIC METABOLIC PANEL
ANION GAP: 14 (ref 5–15)
BUN: 15 mg/dL (ref 6–23)
CO2: 20 mmol/L (ref 19–32)
Calcium: 9.1 mg/dL (ref 8.4–10.5)
Chloride: 107 mmol/L (ref 96–112)
Creatinine, Ser: 1.02 mg/dL (ref 0.50–1.10)
GFR calc Af Amer: 68 mL/min — ABNORMAL LOW (ref >90)
GFR calc non Af Amer: 59 mL/min — ABNORMAL LOW (ref >90)
Glucose, Bld: 140 mg/dL — ABNORMAL HIGH (ref 70–99)
Potassium: 3.5 mmol/L (ref 3.5–5.1)
Sodium: 141 mmol/L (ref 135–145)

## 2014-04-09 LAB — CBC WITH DIFFERENTIAL/PLATELET
Basophils Absolute: 0 10*3/uL (ref 0.0–0.1)
Basophils Relative: 0 % (ref 0–1)
Eosinophils Absolute: 0.1 10*3/uL (ref 0.0–0.7)
Eosinophils Relative: 0 % (ref 0–5)
HCT: 38.3 % (ref 36.0–46.0)
Hemoglobin: 13.2 g/dL (ref 12.0–15.0)
Lymphocytes Relative: 11 % — ABNORMAL LOW (ref 12–46)
Lymphs Abs: 1.4 10*3/uL (ref 0.7–4.0)
MCH: 30.8 pg (ref 26.0–34.0)
MCHC: 34.5 g/dL (ref 30.0–36.0)
MCV: 89.3 fL (ref 78.0–100.0)
Monocytes Absolute: 0.8 10*3/uL (ref 0.1–1.0)
Monocytes Relative: 7 % (ref 3–12)
Neutro Abs: 10.2 10*3/uL — ABNORMAL HIGH (ref 1.7–7.7)
Neutrophils Relative %: 82 % — ABNORMAL HIGH (ref 43–77)
Platelets: 208 10*3/uL (ref 150–400)
RBC: 4.29 MIL/uL (ref 3.87–5.11)
RDW: 13.2 % (ref 11.5–15.5)
WBC: 12.5 10*3/uL — ABNORMAL HIGH (ref 4.0–10.5)

## 2014-04-09 LAB — URINALYSIS, ROUTINE W REFLEX MICROSCOPIC
Bilirubin Urine: NEGATIVE
Glucose, UA: NEGATIVE mg/dL
Hgb urine dipstick: NEGATIVE
Ketones, ur: NEGATIVE mg/dL
LEUKOCYTES UA: NEGATIVE
Nitrite: NEGATIVE
Protein, ur: NEGATIVE mg/dL
Specific Gravity, Urine: 1.01 (ref 1.005–1.030)
Urobilinogen, UA: 0.2 mg/dL (ref 0.0–1.0)
pH: 6 (ref 5.0–8.0)

## 2014-04-09 LAB — I-STAT CG4 LACTIC ACID, ED: Lactic Acid, Venous: 3.86 mmol/L (ref 0.5–2.0)

## 2014-04-09 LAB — I-STAT TROPONIN, ED: Troponin i, poc: 0 ng/mL (ref 0.00–0.08)

## 2014-04-09 MED ORDER — SODIUM CHLORIDE 0.9 % IV BOLUS (SEPSIS)
1000.0000 mL | Freq: Once | INTRAVENOUS | Status: AC
  Administered 2014-04-09: 1000 mL via INTRAVENOUS

## 2014-04-09 NOTE — Discharge Instructions (Signed)
Chest Pain (Nonspecific) °It is often hard to give a specific diagnosis for the cause of chest pain. There is always a chance that your pain could be related to something serious, such as a heart attack or a blood clot in the lungs. You need to follow up with your health care provider for further evaluation. °CAUSES  °· Heartburn. °· Pneumonia or bronchitis. °· Anxiety or stress. °· Inflammation around your heart (pericarditis) or lung (pleuritis or pleurisy). °· A blood clot in the lung. °· A collapsed lung (pneumothorax). It can develop suddenly on its own (spontaneous pneumothorax) or from trauma to the chest. °· Shingles infection (herpes zoster virus). °The chest wall is composed of bones, muscles, and cartilage. Any of these can be the source of the pain. °· The bones can be bruised by injury. °· The muscles or cartilage can be strained by coughing or overwork. °· The cartilage can be affected by inflammation and become sore (costochondritis). °DIAGNOSIS  °Lab tests or other studies may be needed to find the cause of your pain. Your health care provider may have you take a test called an ambulatory electrocardiogram (ECG). An ECG records your heartbeat patterns over a 24-hour period. You may also have other tests, such as: °· Transthoracic echocardiogram (TTE). During echocardiography, sound waves are used to evaluate how blood flows through your heart. °· Transesophageal echocardiogram (TEE). °· Cardiac monitoring. This allows your health care provider to monitor your heart rate and rhythm in real time. °· Holter monitor. This is a portable device that records your heartbeat and can help diagnose heart arrhythmias. It allows your health care provider to track your heart activity for several days, if needed. °· Stress tests by exercise or by giving medicine that makes the heart beat faster. °TREATMENT  °· Treatment depends on what may be causing your chest pain. Treatment may include: °¨ Acid blockers for  heartburn. °¨ Anti-inflammatory medicine. °¨ Pain medicine for inflammatory conditions. °¨ Antibiotics if an infection is present. °· You may be advised to change lifestyle habits. This includes stopping smoking and avoiding alcohol, caffeine, and chocolate. °· You may be advised to keep your head raised (elevated) when sleeping. This reduces the chance of acid going backward from your stomach into your esophagus. °Most of the time, nonspecific chest pain will improve within 2-3 days with rest and mild pain medicine.  °HOME CARE INSTRUCTIONS  °· If antibiotics were prescribed, take them as directed. Finish them even if you start to feel better. °· For the next few days, avoid physical activities that bring on chest pain. Continue physical activities as directed. °· Do not use any tobacco products, including cigarettes, chewing tobacco, or electronic cigarettes. °· Avoid drinking alcohol. °· Only take medicine as directed by your health care provider. °· Follow your health care provider's suggestions for further testing if your chest pain does not go away. °· Keep any follow-up appointments you made. If you do not go to an appointment, you could develop lasting (chronic) problems with pain. If there is any problem keeping an appointment, call to reschedule. °SEEK MEDICAL CARE IF:  °· Your chest pain does not go away, even after treatment. °· You have a rash with blisters on your chest. °· You have a fever. °SEEK IMMEDIATE MEDICAL CARE IF:  °· You have increased chest pain or pain that spreads to your arm, neck, jaw, back, or abdomen. °· You have shortness of breath. °· You have an increasing cough, or you cough   up blood.  You have severe back or abdominal pain.  You feel nauseous or vomit.  You have severe weakness.  You faint.  You have chills. This is an emergency. Do not wait to see if the pain will go away. Get medical help at once. Call your local emergency services (911 in [place] Do not drive  yourself to the hospital. MAKE SURE YOU:   Understand these instructions.  Will watch your condition.  Will get help right away if you are not doing well or get worse. Document Released: 09/28/2004 Document Revised: 12/24/2012 Document Reviewed: 07/25/2007 [facility] Patient Information 2015 [place], [place]. This information is not intended to replace advice given to you by your health care provider. Make sure you discuss any questions you have with your health care provider.  Near-Syncope Near-syncope (commonly known as near fainting) is sudden weakness, dizziness, or feeling like you might pass out. During an episode of near-syncope, you may also develop pale skin, have tunnel vision, or feel sick to your stomach (nauseous). Near-syncope may occur when getting up after sitting or while standing for a long time. It is caused by a sudden decrease in blood flow to the brain. This decrease can result from various causes or triggers, most of which are not serious. However, because near-syncope can sometimes be a sign of something serious, a medical evaluation is required. The specific cause is often not determined. HOME CARE INSTRUCTIONS  Monitor your condition for any changes. The following actions may help to alleviate any discomfort you are experiencing:  Have someone stay with you until you feel stable.  Lie down right away and prop your feet up if you start feeling like you might faint. Breathe deeply and steadily. Wait until all the symptoms have passed. Most of these episodes last only a few minutes. You may feel tired for several hours.   Drink enough fluids to keep your urine clear or pale yellow.   If you are taking blood pressure or heart medicine, get up slowly when seated or lying down. Take several minutes to sit and then stand. This can reduce dizziness.  Follow up with your health care provider as directed. SEEK IMMEDIATE MEDICAL CARE IF:   You have a severe headache.    You have unusual pain in the chest, abdomen, or back.   You are bleeding from the mouth or rectum, or you have black or tarry stool.   You have an irregular or very fast heartbeat.   You have repeated fainting or have seizure-like jerking during an episode.   You faint when sitting or lying down.   You have confusion.   You have difficulty walking.   You have severe weakness.   You have vision problems.  MAKE SURE YOU:   Understand these instructions.  Will watch your condition.  Will get help right away if you are not doing well or get worse. Document Released: 12/19/2004 Document Revised: 12/24/2012 Document Reviewed: 05/24/2012 [facility] Patient Information 2015 [place], [place]. This information is not intended to replace advice given to you by your health care provider. Make sure you discuss any questions you have with your health care provider.

## 2014-04-09 NOTE — ED Provider Notes (Signed)
CSN: [id number]     Arrival date & time 04/09/14  1704 History   First MD Initiated Contact with Patient 04/09/14 1714     Chief Complaint  Patient presents with  . Loss of Consciousness     (Consider location/radiation/quality/duration/timing/severity/associated sxs/prior Treatment) Patient is a 59 y.o. female presenting with syncope. The history is provided by the patient.  Loss of Consciousness Associated symptoms: chest pain and shortness of breath   Associated symptoms: no headaches, no nausea, no vomiting and no weakness    patient presents after a near syncopal episode. Had been working outside all day and hadn't been eating or drinking. Had only eaten breakfast. Began to feel somewhat lightheaded and was having difficulty standing up. Then became more short of breath began to feel pressure on her chest. States it felt as if someone was sitting on. EMS was called and found to be tachycardic. Not hypoxic. Sugar was 1:30. Patient feels somewhat better after treatment with still feels too weak to get up and walk around. No swelling or legs. No nausea or vomiting. States she drank a little protein drink and had some sugar while she was at home. She no longer has chest pressure.  Past Medical History  Diagnosis Date  . Viral meningitis 7/97    2/86  . Anxiety     at time of mother's death  . Depression     at time of mother's death   Past Surgical History  Procedure Laterality Date  . Tubal ligation  1985  . Vein ligation  1992    Bilateral lower legs   Family History  Problem Relation Age of Onset  . Cancer Mother     ovarian cancer  . Alcohol abuse Father    History  Substance Use Topics  . Smoking status: Current Every Day Smoker -- 0.50 packs/day for 40 years    Types: Cigarettes  . Smokeless tobacco: Not on file  . Alcohol Use: Not on file   OB History    No data available     Review of Systems  Constitutional: Negative for activity change and appetite change.   Eyes: Negative for pain.  Respiratory: Positive for shortness of breath. Negative for chest tightness.   Cardiovascular: Positive for chest pain and syncope. Negative for leg swelling.  Gastrointestinal: Negative for nausea, vomiting, abdominal pain and diarrhea.  Genitourinary: Negative for flank pain.  Musculoskeletal: Negative for back pain and neck stiffness.  Skin: Negative for rash.  Neurological: Positive for light-headedness. Negative for weakness, numbness and headaches.  Psychiatric/Behavioral: Negative for behavioral problems.      Allergies  Review of patient's allergies indicates no known allergies.  Home Medications   Prior to Admission medications   Medication Sig Start Date End Date Taking? Authorizing Provider  aspirin EC 81 MG tablet Take 81 mg by mouth daily.   Yes Historical Provider, MD  B Complex-C (SUPER B COMPLEX PO) Take 1 tablet by mouth daily.   Yes Historical Provider, MD  cholecalciferol (VITAMIN D) 1000 UNITS tablet Take 1,000 Units by mouth daily.   Yes Historical Provider, MD  Coconut Oil OIL Take 1 capsule by mouth daily.    Yes Historical Provider, MD  naproxen sodium (ANAPROX) 220 MG tablet Take 220 mg by mouth 2 (two) times daily as needed (for pain).   Yes Historical Provider, MD  doxycycline (VIBRA-TABS) 100 MG tablet Take 1 tablet (100 mg total) by mouth daily. 02/20/14   Tonia Ghent, MD  BP 118/66 mmHg  Pulse 90  Temp(Src) 97.7 F (36.5 C) (Oral)  Resp 20  Ht 5\' 9"  (1.753 m)  Wt 227 lb (102.967 kg)  BMI 33.51 kg/m2  SpO2 100% Physical Exam  Constitutional: She appears well-developed.  HENT:  Head: Normocephalic.  Eyes: Pupils are equal, round, and reactive to light.  Neck: Neck supple.  Cardiovascular: Normal rate.   Pulmonary/Chest: Effort normal.  Abdominal: Soft. There is no tenderness.  Musculoskeletal: She exhibits no edema.  Neurological: She is alert.  Moves all extremities  Skin: Skin is warm.  Nursing note and  vitals reviewed.   ED Course  Procedures (including critical care time) Labs Review Labs Reviewed  BASIC METABOLIC PANEL - Abnormal; Notable for the following:    Glucose, Bld 140 (*)    GFR calc non Af Amer 59 (*)    GFR calc Af Amer 68 (*)    All other components within normal limits  CBC WITH DIFFERENTIAL/PLATELET - Abnormal; Notable for the following:    WBC 12.5 (*)    Neutrophils Relative % 82 (*)    Neutro Abs 10.2 (*)    Lymphocytes Relative 11 (*)    All other components within normal limits  URINALYSIS, ROUTINE W REFLEX MICROSCOPIC - Abnormal; Notable for the following:    APPearance CLOUDY (*)    All other components within normal limits  I-STAT CG4 LACTIC ACID, ED - Abnormal; Notable for the following:    Lactic Acid, Venous 3.86 (*)    All other components within normal limits  I-STAT TROPOININ, ED    Imaging Review Dg Chest Portable 1 View  04/09/2014   CLINICAL DATA:  Chest pressure and shortness of breath today  EXAM: PORTABLE CHEST - 1 VIEW  COMPARISON:  Portable exam 1732 hours without priors for comparison.  FINDINGS: Normal heart size, mediastinal contours and pulmonary vascularity.  Lungs clear.  No pleural effusion or pneumothorax.  Bones unremarkable.  IMPRESSION: No acute abnormalities.   Electronically Signed   By: Lavonia Dana M.D.   On: 04/09/2014 18:06     EKG Interpretation   Date/Time:  Thursday April 09 2014 17:05:06 EDT Ventricular Rate:  92 PR Interval:  163 QRS Duration: 96 QT Interval:  369 QTC Calculation: 456 R Axis:   67 Text Interpretation:  Sinus rhythm Confirmed by Alvino Chapel  MD, Ovid Curd  [phone]) on 04/09/2014 5:21:09 PM      MDM   Final diagnoses:  Syncope, unspecified syncope type  Chest pain, unspecified chest pain type    Patient with near syncope. Likely due to overworking and dehydration. Feels better after treatment. However patient did have some chest pain that was chest pressure. EKG reassuring but in with the pressure  I requested admission, patient was not willing to stay. She was aware of risks and was discharged against medical advice     Davonna Belling, MD 04/09/14 [phone]

## 2014-04-09 NOTE — ED Notes (Addendum)
Per ems patient was working in the yard all day and collapsed. Husband witnessed collapse. Patient lost consciousness for about 2 minutes. Patient came too and stated shortness of breath, fatigue and weakness with diaphoresis. Patient complained of nausea en route. Patient also complains of Neck and left knee pain from falling during syncopal episode.  Was given 4 mg Zofran IV.  CBG 130 BP 114/55 HR 91 97 %

## 2014-04-09 NOTE — ED Notes (Signed)
Dr. Sebastian Ache and Aaron Edelman King-RN notified of elevated CG4 result.

## 2014-05-15 ENCOUNTER — Telehealth: Payer: Self-pay | Admitting: *Deleted

## 2014-05-15 NOTE — Telephone Encounter (Signed)
Called patient to schedule screening mammogram.  Left message for patient to return my call.

## 2014-08-11 ENCOUNTER — Telehealth: Payer: Self-pay | Admitting: *Deleted

## 2014-08-11 ENCOUNTER — Encounter: Payer: Self-pay | Admitting: Family Medicine

## 2014-08-11 NOTE — Telephone Encounter (Signed)
Left message for patient to call in for assistance scheduling mammogram.

## 2017-03-15 ENCOUNTER — Encounter: Payer: Self-pay | Admitting: Internal Medicine

## 2017-10-06 ENCOUNTER — Emergency Department (HOSPITAL_COMMUNITY): Payer: BLUE CROSS/BLUE SHIELD

## 2017-10-06 ENCOUNTER — Emergency Department (HOSPITAL_COMMUNITY)
Admission: EM | Admit: 2017-10-06 | Discharge: 2017-10-07 | Disposition: A | Discharge status: Home / Self Care | Payer: BLUE CROSS/BLUE SHIELD | Attending: Emergency Medicine | Admitting: Emergency Medicine

## 2017-10-06 ENCOUNTER — Other Ambulatory Visit: Payer: Self-pay

## 2017-10-06 DIAGNOSIS — Z79899 Other long term (current) drug therapy: Secondary | ICD-10-CM | POA: Insufficient documentation

## 2017-10-06 DIAGNOSIS — F17210 Nicotine dependence, cigarettes, uncomplicated: Secondary | ICD-10-CM | POA: Diagnosis not present

## 2017-10-06 DIAGNOSIS — Y9301 Activity, walking, marching and hiking: Secondary | ICD-10-CM | POA: Insufficient documentation

## 2017-10-06 DIAGNOSIS — M542 Cervicalgia: Secondary | ICD-10-CM | POA: Diagnosis not present

## 2017-10-06 DIAGNOSIS — Y999 Unspecified external cause status: Secondary | ICD-10-CM | POA: Insufficient documentation

## 2017-10-06 DIAGNOSIS — Y929 Unspecified place or not applicable: Secondary | ICD-10-CM | POA: Insufficient documentation

## 2017-10-06 DIAGNOSIS — Z23 Encounter for immunization: Secondary | ICD-10-CM | POA: Insufficient documentation

## 2017-10-06 DIAGNOSIS — R064 Hyperventilation: Secondary | ICD-10-CM | POA: Diagnosis not present

## 2017-10-06 DIAGNOSIS — W108XXA Fall (on) (from) other stairs and steps, initial encounter: Secondary | ICD-10-CM | POA: Insufficient documentation

## 2017-10-06 DIAGNOSIS — W19XXXA Unspecified fall, initial encounter: Secondary | ICD-10-CM

## 2017-10-06 DIAGNOSIS — R52 Pain, unspecified: Secondary | ICD-10-CM | POA: Diagnosis not present

## 2017-10-06 DIAGNOSIS — Z7982 Long term (current) use of aspirin: Secondary | ICD-10-CM | POA: Diagnosis not present

## 2017-10-06 DIAGNOSIS — S0990XA Unspecified injury of head, initial encounter: Secondary | ICD-10-CM | POA: Diagnosis not present

## 2017-10-06 DIAGNOSIS — R0902 Hypoxemia: Secondary | ICD-10-CM | POA: Diagnosis not present

## 2017-10-06 DIAGNOSIS — S0181XA Laceration without foreign body of other part of head, initial encounter: Secondary | ICD-10-CM

## 2017-10-06 DIAGNOSIS — R51 Headache: Secondary | ICD-10-CM | POA: Diagnosis not present

## 2017-10-06 DIAGNOSIS — S199XXA Unspecified injury of neck, initial encounter: Secondary | ICD-10-CM | POA: Diagnosis not present

## 2017-10-06 DIAGNOSIS — R58 Hemorrhage, not elsewhere classified: Secondary | ICD-10-CM | POA: Diagnosis not present

## 2017-10-06 MED ORDER — LIDOCAINE-EPINEPHRINE (PF) 2 %-1:200000 IJ SOLN
20.0000 mL | Freq: Once | INTRAMUSCULAR | Status: AC
  Administered 2017-10-06: 20 mL
  Filled 2017-10-06: qty 20

## 2017-10-06 MED ORDER — TETANUS-DIPHTH-ACELL PERTUSSIS 5-2.5-18.5 LF-MCG/0.5 IM SUSP
0.5000 mL | Freq: Once | INTRAMUSCULAR | Status: AC
  Administered 2017-10-06: 0.5 mL via INTRAMUSCULAR
  Filled 2017-10-06: qty 0.5

## 2017-10-06 NOTE — ED Triage Notes (Signed)
Per [facility], pt comes from home after walking up stairs carrying groceries. Pt endorses tripping, falling, and hitting forehead on brick stairs. EMS endortses 3 inch left sided laceration with bone visible. Pt reports only thinner use is daily baby aspirin. Pt denies getting dizzy or lightheaded. Pt recieved 200 of fentanyl from EMS and reports pain now 8/10. Pt alert and oriented x4.

## 2017-10-06 NOTE — ED Provider Notes (Signed)
[facility]ERGENC[facility] DEPARTMENT Provider Note   CSN: [id number] Arrival date & time: 10/06/17  2303     History   Chief Complaint Chief Complaint  Patient presents with  . Fall  . Head Laceration    HPI Alison Chung is a 62 y.o. female with a hx of anxiety, depression presents to the Emergency Department complaining of acute, persistent, laceration to the left forehead onset just PTA.  Pt reports she was carrying groceries up the stairs when she tripped and fell. Pt reports she struck her head on the brick steps.  Pt with associated throbbing headache since the incident. She denies vision changes, neck pain, numbness, tingling, weakness, gait disturbance.  EMS reports pt was hyperventilating on their arrival.  Per EMS, this improved after Fentanyl.  Pt has been given 266mcg of Fentanyl prior to arrival.  Pt reports she believes she has "broken her head."  Palpation of the site makes symptoms significantly worse.  Nothing makes it better.    The history is provided by the patient and medical records. No language interpreter was used.    Past Medical History:  Diagnosis Date  . Anxiety    at time of mother's death  . Depression    at time of mother's death  . Viral meningitis 08-16-95    2/86    Patient Active Problem List   Diagnosis Date Noted  . Stye 02/19/2014  . Rosacea 02/19/2014  . AGE (acute gastroenteritis) 10/21/2011  . PURE HYPERCHOLESTEROLEMIA 08/12/2007  . COLONIC POLYPS, BENIGN 03/28/2007  . DIVERTICULOSIS OF COLON 03/28/2007  . WEIGHT GAIN 02/20/2007  . GERD 11/22/2006  . ANXIETY 07/31/2006  . DEPRESSION 07/31/2006  . MENINGITIS NOS 07/31/2006    Past Surgical History:  Procedure Laterality Date  . TUBAL LIGATION  1985  . VEIN LIGATION  1992   Bilateral lower legs     OB History   None      Home Medications    Prior to Admission medications   Medication Sig Start Date End Date Taking? Authorizing Provider  aspirin EC 81 MG  tablet Take 81 mg by mouth daily.   Yes [provider]  B Complex-C (SUPER B COMPLEX PO) Take 1 tablet by mouth daily.   Yes [provider]  cholecalciferol (VITAMIN D) 1000 UNITS tablet Take 1,000 Units by mouth daily.   Yes [provider]  Coconut Oil OIL Take 1 capsule by mouth daily.    Yes [provider]  doxycycline (VIBRA-TABS) 100 MG tablet Take 1 tablet (100 mg total) by mouth daily. Patient not taking: Reported on 10/06/2017 02/20/14   Tonia Ghent, MD    Family History Family History  Problem Relation Age of Onset  . Cancer Mother        ovarian cancer  . Alcohol abuse Father     Social History Social History   Tobacco Use  . Smoking status: Current Every Day Smoker    Packs/day: 0.50    Years: 40.00    Pack years: 20.00    Types: Cigarettes  Substance Use Topics  . Alcohol use: Not on file  . Drug use: Not on file     Allergies   Patient has no known allergies.   Review of Systems Review of Systems  Constitutional: Negative for appetite change, diaphoresis, fatigue, fever and unexpected weight change.  HENT: Negative for mouth sores.   Eyes: Negative for visual disturbance.  Respiratory: Negative for cough, chest  tightness, shortness of breath and wheezing.   Cardiovascular: Negative for chest pain.  Gastrointestinal: Negative for abdominal pain, constipation, diarrhea, nausea and vomiting.  Endocrine: Negative for polydipsia, polyphagia and polyuria.  Genitourinary: Negative for dysuria, frequency, hematuria and urgency.  Musculoskeletal: Negative for back pain and neck stiffness.  Skin: Positive for wound. Negative for rash.  Allergic/Immunologic: Negative for immunocompromised state.  Neurological: Positive for headaches. Negative for syncope and light-headedness.  Hematological: Does not bruise/bleed easily.  Psychiatric/Behavioral: Negative for sleep disturbance. The patient is not nervous/anxious.       Physical Exam Updated Vital Signs BP 114/69 (BP Location: Right Arm)   Pulse 85   Temp 97.7 F (36.5 C) (Oral)   Resp (!) 22   SpO2 99%   Physical Exam  Constitutional: She is oriented to person, place, and time. She appears well-developed and well-nourished. No distress.  HENT:  Head: Normocephalic.  Mouth/Throat: Oropharynx is clear and moist.  3cm laceration to the left forehead No step-off or deformity with palpation Bleeding controlled  Eyes: Pupils are equal, round, and reactive to light. Conjunctivae and EOM are normal. No scleral icterus.  No horizontal, vertical or rotational nystagmus  Neck: Normal range of motion. Neck supple.  Full active and passive ROM without pain No midline or paraspinal tenderness No nuchal rigidity or meningeal signs  Cardiovascular: Normal rate, regular rhythm and intact distal pulses.  Pulmonary/Chest: Effort normal and breath sounds normal. No respiratory distress. She has no wheezes. She has no rales.  Abdominal: Soft. Bowel sounds are normal. There is no tenderness. There is no rebound and no guarding.  Musculoskeletal: Normal range of motion.  Lymphadenopathy:    She has no cervical adenopathy.  Neurological: She is alert and oriented to person, place, and time. No cranial nerve deficit. She exhibits normal muscle tone. Coordination normal.  Mental Status:  Alert, oriented, thought content appropriate. Speech fluent without evidence of aphasia. Able to follow 2 step commands without difficulty.  Cranial Nerves:  II:  Peripheral visual fields grossly normal, pupils equal, round, reactive to light III,IV, VI: ptosis not present, extra-ocular motions intact bilaterally  V,VII: smile symmetric, facial light touch sensation equal VIII: hearing grossly normal bilaterally  IX,X: midline uvula rise  XI: bilateral shoulder shrug equal and strong XII: midline tongue extension  Motor:  5/5 in upper and lower extremities bilaterally  including strong and equal grip strength and dorsiflexion/plantar flexion Sensory: Pinprick and light touch normal in all extremities.  Cerebellar: normal finger-to-nose with bilateral upper extremities Gait: normal gait CV: distal pulses palpable throughout   Skin: Skin is warm and dry. No rash noted. She is not diaphoretic.  Psychiatric: She has a normal mood and affect. Her behavior is normal. Judgment and thought content normal.  Nursing note and vitals reviewed.    ED Treatments / Results  Labs (all labs ordered are listed, but only abnormal results are displayed) Labs Reviewed - No data to display  EKG None  Radiology Ct Head Wo Contrast  Result Date: 10/07/2017 CLINICAL DATA:  Pain after fall EXAM: CT HEAD WITHOUT CONTRAST CT CERVICAL SPINE WITHOUT CONTRAST TECHNIQUE: Multidetector CT imaging of the head and cervical spine was performed following the standard protocol without intravenous contrast. Multiplanar CT image reconstructions of the cervical spine were also generated. COMPARISON:  None. FINDINGS: CT HEAD FINDINGS Brain: No subdural, epidural, or subarachnoid hemorrhage. Cerebellum, brainstem, and basal cisterns are normal. Ventricles and sulci are unremarkable. Scattered white matter changes on the right are nonspecific.  No acute cortical ischemia or infarct. Vascular: No hyperdense vessel or unexpected calcification. Skull: Normal. Negative for fracture or focal lesion. Sinuses/Orbits: No acute finding. Other: Soft tissue swelling over the left anterior and lateral scalp with a laceration. Extracranial soft tissues otherwise normal. CT CERVICAL SPINE FINDINGS Alignment: Normal. Skull base and vertebrae: No acute fracture. No primary bone lesion or focal pathologic process. Soft tissues and spinal canal: No prevertebral fluid or swelling. No visible canal hematoma. Disc levels:  Multilevel degenerative changes. Upper chest: Negative. Other: No other abnormalities. IMPRESSION: 1.  No acute intracranial abnormality. 2. Focal white matter changes in the right corona radiata, nonspecific. 3. No fracture or traumatic malalignment in the cervical spine. Multilevel degenerative changes. Electronically Signed   By: Dorise Bullion III M.D   On: 10/07/2017 00:00   Ct Cervical Spine Wo Contrast  Result Date: 10/07/2017 CLINICAL DATA:  Pain after fall EXAM: CT HEAD WITHOUT CONTRAST CT CERVICAL SPINE WITHOUT CONTRAST TECHNIQUE: Multidetector CT imaging of the head and cervical spine was performed following the standard protocol without intravenous contrast. Multiplanar CT image reconstructions of the cervical spine were also generated. COMPARISON:  None. FINDINGS: CT HEAD FINDINGS Brain: No subdural, epidural, or subarachnoid hemorrhage. Cerebellum, brainstem, and basal cisterns are normal. Ventricles and sulci are unremarkable. Scattered white matter changes on the right are nonspecific. No acute cortical ischemia or infarct. Vascular: No hyperdense vessel or unexpected calcification. Skull: Normal. Negative for fracture or focal lesion. Sinuses/Orbits: No acute finding. Other: Soft tissue swelling over the left anterior and lateral scalp with a laceration. Extracranial soft tissues otherwise normal. CT CERVICAL SPINE FINDINGS Alignment: Normal. Skull base and vertebrae: No acute fracture. No primary bone lesion or focal pathologic process. Soft tissues and spinal canal: No prevertebral fluid or swelling. No visible canal hematoma. Disc levels:  Multilevel degenerative changes. Upper chest: Negative. Other: No other abnormalities. IMPRESSION: 1. No acute intracranial abnormality. 2. Focal white matter changes in the right corona radiata, nonspecific. 3. No fracture or traumatic malalignment in the cervical spine. Multilevel degenerative changes. Electronically Signed   By: Dorise Bullion III M.D   On: 10/07/2017 00:00    Procedures .Marland KitchenLaceration Repair Date/Time: 10/07/2017 1:29 AM Performed  by: Abigail Butts, PA-C Authorized by: Abigail Butts, PA-C   Consent:    Consent obtained:  Verbal   Consent given by:  Patient   Risks discussed:  Infection, need for additional repair, pain, poor cosmetic result and poor wound healing   Alternatives discussed:  No treatment and delayed treatment Universal protocol:    Procedure explained and questions answered to patient or proxy's satisfaction: yes     Relevant documents present and verified: yes     Test results available and properly labeled: yes     Imaging studies available: yes     Required blood products, implants, devices, and special equipment available: yes     Site/side marked: yes     Immediately prior to procedure, a time out was called: yes     Patient identity confirmed:  Verbally with patient and arm band Anesthesia (see MAR for exact dosages):    Anesthesia method:  Local infiltration   Local anesthetic:  Lidocaine 2% WITH epi Laceration details:    Location:  Face   Face location:  Forehead   Length (cm):  3 Repair type:    Repair type:  Intermediate Pre-procedure details:    Preparation:  Patient was prepped and draped in usual sterile fashion and imaging obtained to evaluate  for foreign bodies Exploration:    Hemostasis achieved with:  Epinephrine and direct pressure   Wound exploration: entire depth of wound probed and visualized   Treatment:    Area cleansed with:  Saline   Amount of cleaning:  Standard   Irrigation solution:  Sterile water   Irrigation method:  Syringe Skin repair:    Repair method:  Sutures   Suture size:  6-0   Suture material:  Prolene   Suture technique:  Simple interrupted   Number of sutures:  7 Approximation:    Approximation:  Close Post-procedure details:    Dressing:  Bulky dressing   Patient tolerance of procedure:  Tolerated well, no immediate complications   (including critical care time)  Medications Ordered in ED Medications   lidocaine-EPINEPHrine (XYLOCAINE W/EPI) 2 %-1:200000 (PF) injection 20 mL (20 mLs Infiltration Given 10/06/17 2330)  Tdap (BOOSTRIX) injection 0.5 mL (0.5 mLs Intramuscular Given 10/06/17 2328)     Initial Impression / Assessment and Plan / ED Course  I have reviewed the triage vital signs and the nursing notes.  Pertinent labs & imaging results that were available during my care of the patient were reviewed by me and considered in my medical decision making (see chart for details).     Presents after mechanical fall.  No syncope.  Neurologically intact.  Laceration is noted.  CT scan without evidence of skull fracture, intracranial hemorrhage, cervical fracture.  I personally evaluated these images.  Pressure irrigation performed. Wound explored and base of wound visualized in a bloodless field without evidence of foreign body.  Laceration occurred < 8 hours prior to repair which was well tolerated. Tdap updated.  Pt has no comorbidities to effect normal wound healing. Pt discharged without antibiotics.  Discussed suture home care with patient and answered questions. Pt to follow-up for wound check and suture removal in 7 days; they are to return to the ED sooner for signs of infection. Pt is hemodynamically stable with no complaints prior to dc.    Final Clinical Impressions(s) / ED Diagnoses   Final diagnoses:  Injury of head, initial encounter  Fall, initial encounter  Laceration of forehead, initial encounter    ED Discharge Orders    None       Leticia Coletta, Gwenlyn Perking 57/32/20 [id number]    Delora Fuel, MD 70/62/37 [phone]

## 2017-10-06 NOTE — ED Notes (Signed)
OTF to CT

## 2017-10-06 NOTE — ED Notes (Signed)
ED Provider at bedside. 

## 2017-10-07 NOTE — Discharge Instructions (Addendum)

## 2017-10-12 DIAGNOSIS — S0101XD Laceration without foreign body of scalp, subsequent encounter: Secondary | ICD-10-CM | POA: Diagnosis not present

## 2017-11-08 ENCOUNTER — Ambulatory Visit (INDEPENDENT_AMBULATORY_CARE_PROVIDER_SITE_OTHER): Payer: BLUE CROSS/BLUE SHIELD

## 2017-11-08 DIAGNOSIS — Z23 Encounter for immunization: Secondary | ICD-10-CM

## 2018-11-04 ENCOUNTER — Telehealth: Payer: Self-pay | Admitting: Family Medicine

## 2018-11-04 NOTE — Telephone Encounter (Signed)
Please update patient.  Her husband was recently in and he was asking on her behalf if I knew of any options that would be available over-the-counter and inexpensive for treatment of rosacea.  I do not know of any inexpensive over-the-counter options but it might make sense for her to ask her pharmacist and see if they had any idea.  That is the best I know at this point.  Thanks.

## 2018-11-05 NOTE — Telephone Encounter (Signed)
Left detailed message on voicemail.  

## 2018-11-07 ENCOUNTER — Ambulatory Visit (INDEPENDENT_AMBULATORY_CARE_PROVIDER_SITE_OTHER): Payer: BC Managed Care – PPO

## 2018-11-07 DIAGNOSIS — Z23 Encounter for immunization: Secondary | ICD-10-CM | POA: Diagnosis not present

## 2019-07-22 ENCOUNTER — Encounter: Payer: Self-pay | Admitting: Family Medicine

## 2019-07-22 ENCOUNTER — Other Ambulatory Visit: Payer: Self-pay

## 2019-07-22 ENCOUNTER — Ambulatory Visit (INDEPENDENT_AMBULATORY_CARE_PROVIDER_SITE_OTHER): Payer: Medicare Other | Admitting: Family Medicine

## 2019-07-22 VITALS — BP 136/84 | HR 63 | Temp 96.2°F | Ht 67.5 in | Wt 241.0 lb

## 2019-07-22 DIAGNOSIS — L719 Rosacea, unspecified: Secondary | ICD-10-CM | POA: Diagnosis not present

## 2019-07-22 DIAGNOSIS — L989 Disorder of the skin and subcutaneous tissue, unspecified: Secondary | ICD-10-CM | POA: Diagnosis not present

## 2019-07-22 MED ORDER — BRIMONIDINE TARTRATE 0.33 % EX GEL: CUTANEOUS | 1 refills | Status: AC

## 2019-07-22 NOTE — Progress Notes (Signed)
This visit occurred during the SARS-CoV-2 public health emergency.  Safety protocols were in place, including screening questions prior to the visit, additional usage of staff PPE, and extensive cleaning of exam room while observing appropriate contact time as indicated for disinfecting solutions.  Eye sx.  H/o rosacea.  Doxycycline didn't help. She has tried witch hazel, etc.  Worse with sun exposure.  Noted near the eyebrows, around the nose, lower eyelids.   She wanted evaluation for L scalp lesion, this is not a new lesion per patient report, 3 x 1.5cm, irritated.     She had prev covid vaccine.  D/w pt.    Husband with neuropathy, d/w pt.     Meds, vitals, and allergies reviewed.   ROS: Per HPI unless specifically indicated in ROS section   nad She has findings typical for facial rosacea with irritation and blanching erythema on the left upper and lower eyelid and near the nose.  Less noticeable on the right upper and lower eyelid. She has a left scalp lesion that is 3 x 1.5 cm and irritated.  No discharge. Skin tag incidentally noted on L upper eyelid.   Neck supple.  No lymphadenopathy.

## 2019-07-22 NOTE — Patient Instructions (Addendum)
We'll call about seeing dermatology.  See if you can get Brimonidine and use that on the facial rash.  Take care.  Glad to see you.

## 2019-07-23 DIAGNOSIS — L989 Disorder of the skin and subcutaneous tissue, unspecified: Secondary | ICD-10-CM | POA: Insufficient documentation

## 2019-07-23 NOTE — Assessment & Plan Note (Signed)
Given the size I would like dermatology input.  Refer.  She agrees.

## 2019-07-23 NOTE — Assessment & Plan Note (Signed)
She can try brimonidine topically and see if that helps.  We are going to refer her to dermatology anyway given the scalp lesion.  She will price check brimonidine in the meantime.

## 2019-07-24 ENCOUNTER — Telehealth: Payer: Self-pay

## 2019-07-24 NOTE — Telephone Encounter (Signed)
Noted. Will await derm consult note.  Thanks.

## 2019-07-24 NOTE — Telephone Encounter (Signed)
Gel rx'd@[facility]  for Rosacea is over $300@pharmacy .  Pt didn't p/u Rx.  She will wait to see what dermatologist, Dr. Rinaldo Ratel recommends@8 -2-21 visit.

## 2019-08-04 DIAGNOSIS — C4449 Other specified malignant neoplasm of skin of scalp and neck: Secondary | ICD-10-CM | POA: Diagnosis not present

## 2019-08-04 DIAGNOSIS — L718 Other rosacea: Secondary | ICD-10-CM | POA: Diagnosis not present

## 2019-09-02 DIAGNOSIS — C4449 Other specified malignant neoplasm of skin of scalp and neck: Secondary | ICD-10-CM | POA: Diagnosis not present

## 2019-09-12 DIAGNOSIS — C4449 Other specified malignant neoplasm of skin of scalp and neck: Secondary | ICD-10-CM | POA: Diagnosis not present

## 2019-12-17 ENCOUNTER — Ambulatory Visit: Payer: Medicare Other

## 2019-12-18 ENCOUNTER — Ambulatory Visit (INDEPENDENT_AMBULATORY_CARE_PROVIDER_SITE_OTHER): Payer: Medicare Other

## 2019-12-18 ENCOUNTER — Other Ambulatory Visit: Payer: Self-pay

## 2019-12-18 DIAGNOSIS — Z23 Encounter for immunization: Secondary | ICD-10-CM | POA: Diagnosis not present

## 2020-03-24 DIAGNOSIS — H40003 Preglaucoma, unspecified, bilateral: Secondary | ICD-10-CM | POA: Diagnosis not present

## 2020-03-24 DIAGNOSIS — H524 Presbyopia: Secondary | ICD-10-CM | POA: Diagnosis not present

## 2020-08-29 ENCOUNTER — Ambulatory Visit: Payer: Medicare Other

## 2022-08-22 ENCOUNTER — Telehealth: Payer: Self-pay | Admitting: Family Medicine

## 2022-08-22 NOTE — Telephone Encounter (Signed)
Patient last seen in 2021- called patient to schedule an appointment, left voicemail

## 2022-09-01 ENCOUNTER — Encounter: Payer: Self-pay | Admitting: Family Medicine

## 2022-09-01 ENCOUNTER — Ambulatory Visit (INDEPENDENT_AMBULATORY_CARE_PROVIDER_SITE_OTHER): Payer: Medicare Other | Admitting: Family Medicine

## 2022-09-01 VITALS — BP 120/82 | HR 94 | Temp 97.9°F | Ht 67.5 in | Wt 229.0 lb

## 2022-09-01 DIAGNOSIS — Z131 Encounter for screening for diabetes mellitus: Secondary | ICD-10-CM | POA: Diagnosis not present

## 2022-09-01 DIAGNOSIS — L719 Rosacea, unspecified: Secondary | ICD-10-CM | POA: Diagnosis not present

## 2022-09-01 DIAGNOSIS — Z1211 Encounter for screening for malignant neoplasm of colon: Secondary | ICD-10-CM

## 2022-09-01 DIAGNOSIS — Z0000 Encounter for general adult medical examination without abnormal findings: Secondary | ICD-10-CM | POA: Diagnosis not present

## 2022-09-01 DIAGNOSIS — Z1321 Encounter for screening for nutritional disorder: Secondary | ICD-10-CM | POA: Diagnosis not present

## 2022-09-01 DIAGNOSIS — Z7189 Other specified counseling: Secondary | ICD-10-CM

## 2022-09-01 DIAGNOSIS — Z13220 Encounter for screening for lipoid disorders: Secondary | ICD-10-CM | POA: Diagnosis not present

## 2022-09-01 NOTE — Progress Notes (Unsigned)
She is helping caring for her husband, d/w pt.   Has used metrogel and brimonidine for rosacea.  Meds helped.  No ADE on med.    Tetanus 2019 Flu d/w pt.  PNA d/w pt.  Covid prev done Shingles d/w pt.   D/w patient [id number] for colon cancer screening, including IFOB vs. colonoscopy.  Risks and benefits of both were discussed and patient voiced understanding.  Pt elects for: cologuard.  DXA- Encouraged Mammogram. Encouraged.   Living will d/w pt.  Husband (then if needed son) designated if patient were incapacitated.   Diet and exercise d/w pt.    D/w pt about checking labs re: vit D screening, lipids, DM 2 screening.    Meds, vitals, and allergies reviewed.   ROS: Per HPI unless specifically indicated in ROS section   GEN: nad, alert and oriented HEENT: mucous membranes moist NECK: supple w/o LA CV: rrr.  no murmur PULM: ctab, no inc wob ABD: soft, +bs EXT: Chronic changes on the L leg in stocking at baseline.   SKIN: no new rash.    25 minutes were devoted to patient care in this encounter (this includes time spent reviewing the patient's file/history, interviewing and examining the patient, counseling/reviewing plan with patient).

## 2022-09-01 NOTE — Patient Instructions (Signed)
I would get a flu shot each fall.   Let me know if you need an order for bone density/mammogram.  Go to the lab on the way out.   If you have mychart we'll likely use that to update you.    Take care.  Glad to see you.

## 2022-09-02 LAB — LIPID PANEL
Cholesterol: 227 mg/dL — ABNORMAL HIGH (ref <200)
HDL: 43 mg/dL — ABNORMAL LOW (ref >=50)
LDL Cholesterol (Calc): 148 mg/dL — ABNORMAL HIGH
Non-HDL Cholesterol (Calc): 184 mg/dL — ABNORMAL HIGH (ref <130)
Total CHOL/HDL Ratio: 5.3 (calc) — ABNORMAL HIGH (ref <5.0)
Triglycerides: 226 mg/dL — ABNORMAL HIGH (ref <150)

## 2022-09-02 LAB — VITAMIN D 25 HYDROXY (VIT D DEFICIENCY, FRACTURES): Vit D, 25-Hydroxy: 63 ng/mL (ref 30–100)

## 2022-09-02 LAB — BASIC METABOLIC PANEL
BUN: 16 mg/dL (ref 7–25)
CO2: 24 mmol/L (ref 20–32)
Calcium: 9.4 mg/dL (ref 8.6–10.4)
Chloride: 106 mmol/L (ref 98–110)
Creat: 0.81 mg/dL (ref 0.50–1.05)
Glucose, Bld: 158 mg/dL — ABNORMAL HIGH (ref 65–99)
Potassium: 4.3 mmol/L (ref 3.5–5.3)
Sodium: 141 mmol/L (ref 135–146)

## 2022-09-04 ENCOUNTER — Other Ambulatory Visit: Payer: Self-pay | Admitting: Family Medicine

## 2022-09-04 DIAGNOSIS — R739 Hyperglycemia, unspecified: Secondary | ICD-10-CM

## 2022-09-04 DIAGNOSIS — Z0000 Encounter for general adult medical examination without abnormal findings: Secondary | ICD-10-CM | POA: Insufficient documentation

## 2022-09-04 DIAGNOSIS — Z7189 Other specified counseling: Secondary | ICD-10-CM | POA: Insufficient documentation

## 2022-09-04 NOTE — Assessment & Plan Note (Signed)
Has used metrogel and brimonidine for rosacea.  Meds helped.  No ADE on med.  Continue as is.

## 2022-09-04 NOTE — Assessment & Plan Note (Signed)
Living will d/w pt.  Husband (then if needed son) designated if patient were incapacitated.

## 2022-09-04 NOTE — Assessment & Plan Note (Addendum)
Tetanus 2019 Flu d/w pt.  PNA d/w pt.  Covid prev done Shingles d/w pt.   D/w patient [id number] for colon cancer screening, including IFOB vs. colonoscopy.  Risks and benefits of both were discussed and patient voiced understanding.  Pt elects for: cologuard.  DXA- Encouraged Mammogram. Encouraged.   Living will d/w pt.  Husband (then if needed son) designated if patient were incapacitated.   Diet and exercise d/w pt.    D/w pt about checking labs re: vit D screening, lipids, DM 2 screening.

## 2022-09-14 ENCOUNTER — Other Ambulatory Visit (INDEPENDENT_AMBULATORY_CARE_PROVIDER_SITE_OTHER): Payer: Medicare Other

## 2022-09-14 DIAGNOSIS — R739 Hyperglycemia, unspecified: Secondary | ICD-10-CM | POA: Diagnosis not present

## 2022-09-14 LAB — HEMOGLOBIN A1C: Hgb A1c MFr Bld: 8.8 % — ABNORMAL HIGH (ref 4.6–6.5)

## 2022-09-15 DIAGNOSIS — Z1211 Encounter for screening for malignant neoplasm of colon: Secondary | ICD-10-CM | POA: Diagnosis not present

## 2022-09-17 ENCOUNTER — Encounter: Payer: Self-pay | Admitting: Family Medicine

## 2022-09-17 DIAGNOSIS — E119 Type 2 diabetes mellitus without complications: Secondary | ICD-10-CM | POA: Insufficient documentation

## 2022-09-20 ENCOUNTER — Other Ambulatory Visit: Payer: Self-pay | Admitting: Family Medicine

## 2022-09-20 MED ORDER — METFORMIN HCL 500 MG PO TABS: ORAL_TABLET | ORAL | 3 refills | Status: DC

## 2022-09-22 LAB — COLOGUARD: COLOGUARD: POSITIVE — AB

## 2022-09-24 ENCOUNTER — Other Ambulatory Visit: Payer: Self-pay | Admitting: Family Medicine

## 2022-09-24 DIAGNOSIS — R195 Other fecal abnormalities: Secondary | ICD-10-CM

## 2022-12-26 ENCOUNTER — Encounter: Payer: Self-pay | Admitting: Pharmacist

## 2022-12-26 NOTE — Progress Notes (Signed)
Pharmacy Quality Measure Review  This patient is appearing on a report for being at risk of failing the adherence measure for diabetes medications this calendar year.   Medication: metformin 500 mg Last fill date: 9/19 for 90 day supply  Insurance report was not up to date. No action needed at this time.  Medication has been refilled as of 12/20/22 for 90 ds

## 2023-01-04 ENCOUNTER — Ambulatory Visit (INDEPENDENT_AMBULATORY_CARE_PROVIDER_SITE_OTHER): Payer: Medicare Other | Admitting: Family Medicine

## 2023-01-04 ENCOUNTER — Encounter: Payer: Self-pay | Admitting: Family Medicine

## 2023-01-04 VITALS — BP 138/68 | HR 92 | Temp 98.5°F | Ht 67.5 in | Wt 215.2 lb

## 2023-01-04 DIAGNOSIS — E119 Type 2 diabetes mellitus without complications: Secondary | ICD-10-CM | POA: Diagnosis not present

## 2023-01-04 DIAGNOSIS — Z7984 Long term (current) use of oral hypoglycemic drugs: Secondary | ICD-10-CM

## 2023-01-04 DIAGNOSIS — R195 Other fecal abnormalities: Secondary | ICD-10-CM | POA: Diagnosis not present

## 2023-01-04 MED ORDER — METFORMIN HCL 500 MG PO TABS: 500.0000 mg | ORAL_TABLET | Freq: Two times a day (BID) | ORAL | Status: DC

## 2023-01-04 NOTE — Progress Notes (Signed)
 Diabetes:  Metformin  BID, no ADE on med.  Hypoglycemic episodes: no Hyperglycemic episodes: no Feet problems: she has pain at night at baseline.   Blood Sugars averaging: ~140s eye exam within last year: due, d/w pt.   Labs pending.  She is down ~15lbs with diet changes.   Discussed statin indication.  See notes on follow-up labs.  Cologuard positive.  D/w pt about GI follow up. She doesn't want to leave her husband for the day for the procedure.  Discussed options.  Advised to get checked, d/w pt. Discussed that it is reasonable to assume she has at minimum a precancerous polyp.  Had flu and PNA vaccines at [facility].  Shingles shot d/w pt.    D/w pt about lung cancer screening program along with routine health care maintenance.   Meds, vitals, and allergies reviewed.   ROS: Per HPI unless specifically indicated in ROS section   GEN: nad, alert and oriented HEENT: ncat NECK: supple w/o LA CV: rrr. PULM: ctab, no inc wob ABD: soft, +bs EXT: no edema SKIN: no acute rash  Diabetic foot exam: Normal inspection No skin breakdown No calluses  Normal DP pulses Normal sensation to light touch and monofilament Nails normal  32 minutes were devoted to patient care in this encounter (this includes time spent reviewing the patient's file/history, interviewing and examining the patient, counseling/reviewing plan with patient).

## 2023-01-04 NOTE — Patient Instructions (Addendum)
 Go to the lab on the way out.   If you have mychart we'll likely use that to update you.    Eye exam when possible.  Ask them to check your eyes for diabetes.  Plan on recheck in about 3 months.   Take care.  Glad to see you. Please call GI about follow up for a colonoscopy.

## 2023-01-05 LAB — HEPATIC FUNCTION PANEL
ALT: 23 U/L (ref 0–35)
AST: 25 U/L (ref 0–37)
Albumin: 4.7 g/dL (ref 3.5–5.2)
Alkaline Phosphatase: 62 U/L (ref 39–117)
Bilirubin, Direct: 0 mg/dL (ref 0.0–0.3)
Total Bilirubin: 0.5 mg/dL (ref 0.2–1.2)
Total Protein: 7.6 g/dL (ref 6.0–8.3)

## 2023-01-05 LAB — HEMOGLOBIN A1C: Hgb A1c MFr Bld: 6.8 % — ABNORMAL HIGH (ref 4.6–6.5)

## 2023-01-07 ENCOUNTER — Other Ambulatory Visit: Payer: Self-pay | Admitting: Family Medicine

## 2023-01-07 DIAGNOSIS — R195 Other fecal abnormalities: Secondary | ICD-10-CM | POA: Insufficient documentation

## 2023-01-07 MED ORDER — ATORVASTATIN CALCIUM 10 MG PO TABS: 10.0000 mg | ORAL_TABLET | Freq: Every day | ORAL | 3 refills | Status: DC

## 2023-01-07 NOTE — Assessment & Plan Note (Signed)
 Cologuard positive.  She was called about this last fall.  D/w pt about GI follow up. She doesn't want to leave her husband for the day for the procedure.  Discussed options.  Advised to get checked, d/w pt. Discussed that it is reasonable to assume she has at minimum a precancerous polyp.

## 2023-01-07 NOTE — Assessment & Plan Note (Signed)
 She is down ~15lbs with diet changes.   Discussed statin indication.  See notes on follow-up labs. Continue metformin. Plan on recheck in about 3 months.

## 2023-01-08 ENCOUNTER — Telehealth: Payer: Self-pay

## 2023-01-08 NOTE — Telephone Encounter (Signed)
-----   Message from Crawford Givens sent at 01/07/2023  9:21 PM EST ----- Had flu and PNA at [facility].   Please get the records and update EMR.  Thanks.

## 2023-01-08 NOTE — Telephone Encounter (Signed)
 Updated.

## 2023-01-12 ENCOUNTER — Encounter: Payer: Self-pay | Admitting: Family Medicine

## 2023-04-05 ENCOUNTER — Encounter: Payer: Self-pay | Admitting: Family Medicine

## 2023-04-05 ENCOUNTER — Ambulatory Visit: Payer: Medicare Other | Admitting: Family Medicine

## 2023-04-05 VITALS — BP 136/72 | HR 77 | Temp 98.8°F | Ht 67.5 in | Wt 210.6 lb

## 2023-04-05 DIAGNOSIS — R195 Other fecal abnormalities: Secondary | ICD-10-CM

## 2023-04-05 DIAGNOSIS — E119 Type 2 diabetes mellitus without complications: Secondary | ICD-10-CM | POA: Diagnosis not present

## 2023-04-05 DIAGNOSIS — Z789 Other specified health status: Secondary | ICD-10-CM | POA: Diagnosis not present

## 2023-04-05 LAB — POCT GLYCOSYLATED HEMOGLOBIN (HGB A1C): Hemoglobin A1C: 6 % — AB (ref 4.0–5.6)

## 2023-04-05 MED ORDER — METFORMIN HCL 500 MG PO TABS: 500.0000 mg | ORAL_TABLET | Freq: Two times a day (BID) | ORAL | 3 refills | Status: DC

## 2023-04-05 NOTE — Progress Notes (Signed)
 Diabetes:  Using medications without difficulties:yes Hypoglycemic episodes: no Hyperglycemic episodes: no Feet problems: no Blood Sugars averaging: 110-120s eye exam within last year: d/w pt about getting scheduled.  A1c d/w pt at [facility].   D/w pt about lipitor.  She had sugar elevation and itching while on med. Off med currently.    D/w pt about getting colonoscopy done.  Encouraged to get that done when possible.    Meds, vitals, and allergies reviewed.  ROS: Per HPI unless specifically indicated in ROS section   GEN: nad, alert and oriented HEENT: ncat NECK: supple w/o LA CV: rrr. PULM: ctab, no inc wob ABD: soft, +bs EXT: no edema SKIN: no acute rash

## 2023-04-05 NOTE — Patient Instructions (Addendum)
 Please see about the eye clinic appointment and GI clinic appointment.  Take care.  Glad to see you. Plan on a yearly visit in about 6 months.  Labs at the visit.

## 2023-04-08 ENCOUNTER — Other Ambulatory Visit: Payer: Self-pay | Admitting: Family Medicine

## 2023-04-08 DIAGNOSIS — Z789 Other specified health status: Secondary | ICD-10-CM | POA: Insufficient documentation

## 2023-04-08 NOTE — Assessment & Plan Note (Signed)
 She had sugar elevation and itching while on med. Off med currently.

## 2023-04-08 NOTE — Assessment & Plan Note (Signed)
 A1c improved.  Continue metformin.  Continue work on diet and exercise.  Recheck in 6 months.  See following phone note.

## 2023-04-08 NOTE — Assessment & Plan Note (Signed)
 D/w pt about getting colonoscopy done.  Encouraged to get that done when possible.

## 2023-04-08 NOTE — Telephone Encounter (Signed)
 Please call patient.  Offer Zetia for her cholesterol.  It is not a statin and she should be able to tolerate it.  If she consents, let me know.  I pended the prescription below.  Thanks.

## 2023-04-10 NOTE — Telephone Encounter (Signed)
 Left voicemail for patient to return call to office.

## 2023-04-11 NOTE — Telephone Encounter (Signed)
 Left voicemail for patient to return call to office.

## 2023-04-13 NOTE — Telephone Encounter (Signed)
 Left voicemail for patient to return call to office.

## 2023-04-18 NOTE — Telephone Encounter (Signed)
 I have attempted to call this patient several times in reference to the prescription with no call back so I sent the patient a letter.

## 2023-04-22 NOTE — Telephone Encounter (Signed)
 Noted.  I cancelled the rx.  Thanks.  Will await response from patient.

## 2023-06-28 ENCOUNTER — Ambulatory Visit

## 2023-07-10 ENCOUNTER — Telehealth: Payer: Self-pay

## 2023-07-10 NOTE — Telephone Encounter (Signed)
 Lvm to schedule CPE w prior labs

## 2023-07-10 NOTE — Telephone Encounter (Signed)
 Called left message to call office. Patient is due for physical. Please schedule when patient.

## 2023-07-17 NOTE — Telephone Encounter (Signed)
 Lvm to schedule physical and sent mychart

## 2023-07-17 NOTE — Telephone Encounter (Signed)
 Pt called back. However, I was having issues with the decision tree and could not correctly scheduled a CPE. It was only resulting a AWV. Please call and advise.

## 2023-08-07 ENCOUNTER — Ambulatory Visit (INDEPENDENT_AMBULATORY_CARE_PROVIDER_SITE_OTHER)

## 2023-08-07 VITALS — BP 136/72 | Ht 67.5 in | Wt 190.0 lb

## 2023-08-07 DIAGNOSIS — Z0000 Encounter for general adult medical examination without abnormal findings: Secondary | ICD-10-CM | POA: Diagnosis not present

## 2023-08-07 NOTE — Patient Instructions (Signed)
 Ms. Alison Chung , Thank you for taking time out of your busy schedule to complete your Annual Wellness Visit with me. I enjoyed our conversation and look forward to speaking with you again next year. I, as well as your care team,  appreciate your ongoing commitment to your health goals. Please review the following plan we discussed and let me know if I can assist you in the future. Your Game plan/ To Do List    Referrals: If you haven't heard from the office you've been referred to, please reach out to them at the phone provided.   Follow up Visits: We will see or speak with you next year for your Next Medicare AWV with our clinical staff Have you seen your provider in the last 6 months (3 months if uncontrolled diabetes)? Yes  Clinician Recommendations:  Aim for 30 minutes of exercise or brisk walking, 6-8 glasses of water, and 5 servings of fruits and vegetables each day.       This is a list of the screenings recommended for you:  Health Maintenance  Topic Date Due   Eye exam for diabetics  Never done   Yearly kidney health urinalysis for diabetes  Never done   Hepatitis C Screening  Never done   Screening for Lung Cancer  Never done   Mammogram  Never done   Zoster (Shingles) Vaccine (1 of 2) Never done   DEXA scan (bone density measurement)  Never done   COVID-19 Vaccine (4 - 2024-25 season) 09/03/2022   Flu Shot  08/03/2023   Yearly kidney function blood test for diabetes  09/01/2023   Hemoglobin A1C  10/05/2023   Complete foot exam   01/04/2024   Medicare Annual Wellness Visit  08/06/2024   Cologuard (Stool DNA test)  09/14/2025   DTaP/Tdap/Td vaccine (2 - Td or Tdap) 10/07/2027   Pneumococcal Vaccine for age over 29  Completed   Hepatitis B Vaccine  Aged Out   HPV Vaccine  Aged Out   Meningitis B Vaccine  Aged Out   Colon Cancer Screening  Discontinued    Advanced directives: (Copy Requested) Please bring a copy of your health care power of attorney and living will to the  office to be added to your chart at your convenience. You can mail to [facility] [address] [place], [place] [address] or email to [email or web address] Advance Care Planning is important because it:  [x]  Makes sure you receive the medical care that is consistent with your values, goals, and preferences  [x]  It provides guidance to your family and loved ones and reduces their decisional burden about whether or not they are making the right decisions based on your wishes.  Follow the link provided in your after visit summary or read over the paperwork we have mailed to you to help you started getting your Advance Directives in place. If you need assistance in completing these, please reach out to us  so that we can help you!  See attachments for Preventive Care and Fall Prevention Tips.

## 2023-08-07 NOTE — Progress Notes (Signed)
 Because this visit was a virtual/telehealth visit,  certain criteria was not obtained, such a blood pressure, CBG if applicable, and timed get up and go. Any medications not marked as taking were not mentioned during the medication reconciliation part of the visit. Any vitals not documented were not able to be obtained due to this being a telehealth visit or patient was unable to self-report a recent blood pressure reading due to a lack of equipment at home via telehealth. Vitals that have been documented are verbally provided by the patient.  This visit was performed by a medical professional under my direct supervision. I was immediately available for consultation/collaboration. I have reviewed and agree with the Annual Wellness Visit documentation.  Subjective:   Alison Chung is a 68 y.o. who presents for a Medicare Wellness preventive visit.  As a reminder, Annual Wellness Visits don't include a physical exam, and some assessments may be limited, especially if this visit is performed virtually. We may recommend an in-person follow-up visit with your provider if needed.  Visit Complete: Virtual I connected with  Alison Chung on 08/07/23 by a audio enabled telemedicine application and verified that I am speaking with the correct person using two identifiers.  Patient Location: Home  Provider Location: Home Office  I discussed the limitations of evaluation and management by telemedicine. The patient expressed understanding and agreed to proceed.  Vital Signs: Because this visit was a virtual/telehealth visit, some criteria may be missing or patient reported. Any vitals not documented were not able to be obtained and vitals that have been documented are patient reported.  VideoDeclined- This patient declined Librarian, academic. Therefore the visit was completed with audio only.  Persons Participating in Visit: Patient.  AWV Questionnaire: No: Patient  Medicare AWV questionnaire was not completed prior to this visit.  Cardiac Risk Factors include: advanced age (>32men, >26 women);diabetes mellitus     Objective:    Today's Vitals   08/07/23 1513  BP: 136/72  Weight: 190 lb (86.2 kg)  Height: 5' 7.5 (1.715 m)   Body mass index is 29.32 kg/m.     08/07/2023    3:12 PM 04/09/2014    5:14 PM  Advanced Directives  Does Patient Have a Medical Advance Directive? Yes No   Type of Estate agent of [place];Living will   Copy of Healthcare Power of Attorney in Chart? No - copy requested      Data saved with a previous flowsheet row definition    Current Medications (verified) Outpatient Encounter Medications as of 08/07/2023  Medication Sig   aspirin EC 81 MG tablet Take 81 mg by mouth daily.   B Complex-C (SUPER B COMPLEX PO) Take 1 tablet by mouth daily.   Brimonidine  Tartrate 0.33 % GEL Apply a pea-size amount once daily as a thin layer across the face   cholecalciferol (VITAMIN D ) 1000 UNITS tablet Take 1,000 Units by mouth daily.   Cinnamon 500 MG capsule Take 500 mg by mouth daily. 2 capsules   Coconut Oil OIL Take 2 capsules by mouth daily.    metFORMIN  (GLUCOPHAGE ) 500 MG tablet Take 1 tablet (500 mg total) by mouth 2 (two) times daily with a meal.   metroNIDAZOLE  (METROGEL ) 0.75 % gel Apply 1 Application topically 2 (two) times daily.   Misc Natural Products (OSTEO BI-FLEX ADV DOUBLE ST PO) Take by mouth. 2 tabs in the AM.   Multiple Vitamin (MULTIVITAMIN) tablet Take 1 tablet by mouth daily.  No facility-administered encounter medications on file as of 08/07/2023.    Allergies (verified) Lipitor [atorvastatin ]   History: Past Medical History:  Diagnosis Date   Anxiety    at time of mother's death   Depression    at time of mother's death   Viral meningitis    Past Surgical History:  Procedure Laterality Date   TUBAL LIGATION  1985   VEIN LIGATION  1992   Bilateral lower legs   Family  History  Problem Relation Age of Onset   Cancer Mother        ovarian cancer   Alcohol abuse Father    Colon cancer Neg Hx    Breast cancer Neg Hx    Social History   Socioeconomic History   Marital status: Married    Spouse name: Not on file   Number of children: 2   Years of education: Not on file   Highest education level: Not on file  Occupational History   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  Tobacco Use   Smoking status: Former    Current packs/day: 0.50    Average packs/day: 0.5 packs/day for 40.0 years (20.0 ttl pk-yrs)    Types: Cigarettes   Smokeless tobacco: Never  Substance and Sexual Activity   Alcohol use: Never   Drug use: Never   Sexual activity: Not on file  Other Topics Concern   Not on file  Social History Narrative   Married 1977   2 kids   Social Drivers of Health   Financial Resource Strain: Low Risk  (08/07/2023)   Overall Financial Resource Strain (CARDIA)    Difficulty of Paying Living Expenses: Not hard at all  Food Insecurity: No Food Insecurity (08/07/2023)   Hunger Vital Sign    Worried About Running Out of Food in the Last Year: Never true    Ran Out of Food in the Last Year: Never true  Transportation Needs: No Transportation Needs (08/07/2023)   PRAPARE - Administrator, Civil Service (Medical): No    Lack of Transportation (Non-Medical): No  Physical Activity: Sufficiently Active (08/07/2023)   Exercise Vital Sign    Days of Exercise per Week: 7 days    Minutes of Exercise per Session: 30 min  Stress: No Stress Concern Present (08/07/2023)   [employer] of Occupational Health - Occupational Stress Questionnaire    Feeling of Stress: Not at all  Social Connections: Socially Integrated (08/07/2023)   Social Connection and Isolation Panel    Frequency of Communication with Friends and Family: More than three times a week    Frequency of Social Gatherings with Friends and Family: More than three times  a week    Attends Religious Services: More than 4 times per year    Active Member of [employer] or Organizations: Yes    Attends Engineer, structural: More than 4 times per year    Marital Status: Married    Tobacco Counseling Counseling given: Not Answered    Clinical Intake:  Pre-visit preparation completed: Yes  Pain : No/denies pain     BMI - recorded: 29.32 Nutritional Status: BMI 25 -29 Overweight Nutritional Risks: None Diabetes: Yes CBG done?: No Did pt. bring in CBG monitor from home?: No  Lab Results  Component Value Date   HGBA1C 6.0 (A) 04/05/2023   HGBA1C 6.8 (H) 01/04/2023   HGBA1C 8.8 (H) 09/14/2022     How often do you need to have someone help  you when you read instructions, pamphlets, or other written materials from your doctor or pharmacy?: 1 - Never  Interpreter Needed?: No  Information entered by :: Amiyrah Lamere,CMA   Activities of Daily Living     08/07/2023    3:17 PM  In your present state of health, do you have any difficulty performing the following activities:  Hearing? 0  Vision? 0  Difficulty concentrating or making decisions? 0  Walking or climbing stairs? 0  Dressing or bathing? 0  Doing errands, shopping? 0  Preparing Food and eating ? N  Using the Toilet? N  In the past six months, have you accidently leaked urine? N  Do you have problems with loss of bowel control? N  Managing your Medications? N  Managing your Finances? N  Housekeeping or managing your Housekeeping? N    Patient Care Team: Cleatus Arlyss RAMAN, MD as PCP - General (Family Medicine)  I have updated your Care Teams any recent Medical Services you may have received from other providers in the past year.     Assessment:   This is a routine wellness examination for [facility]  Hearing/Vision screen Hearing Screening - Comments:: Patient has no difficulties  Vision Screening - Comments:: Patient wears g   Goals Addressed             This Visit's  Progress    Patient Stated       To lose some weight        Depression Screen     08/07/2023    3:18 PM 04/05/2023    3:43 PM 01/04/2023    3:37 PM 09/01/2022    2:32 PM  PHQ 2/9 Scores  PHQ - 2 Score 0 0 0 0  PHQ- 9 Score 0 0 0 1    Fall Risk     08/07/2023    3:16 PM 04/05/2023    3:43 PM 01/04/2023    3:37 PM 09/01/2022    2:31 PM  Fall Risk   Falls in the past year? 0 0 0 1  Number falls in past yr: 0 0 0 0  Injury with Fall? 0 0 0 0  Risk for fall due to : No Fall Risks No Fall Risks No Fall Risks No Fall Risks  Follow up Falls evaluation completed Falls evaluation completed Falls evaluation completed Falls evaluation completed    MEDICARE RISK AT HOME:  Medicare Risk at Home Any stairs in or around the home?: No If so, are there any without handrails?: No Home free of loose throw rugs in walkways, pet beds, electrical cords, etc?: Yes Adequate lighting in your home to reduce risk of falls?: Yes Life alert?: No Use of a cane, walker or w/c?: No Grab bars in the bathroom?: Yes Shower chair or bench in shower?: Yes Elevated toilet seat or a handicapped toilet?: Yes  TIMED UP AND GO:  Was the test performed?  No  Cognitive Function: 6CIT completed        08/07/2023    3:21 PM  6CIT Screen  What Year? 0 points  What month? 0 points  What time? 0 points  Count back from 20 0 points  Months in reverse 0 points  Repeat phrase 0 points  Total Score 0 points    Immunizations Immunization History  Administered Date(s) Administered   Fluad Quad(high Dose 65+) 11/07/2018, 12/18/2019, 10/06/2021, 10/25/2022   Influenza,inj,Quad PF,6+ Mos 11/08/2017   Influenza-Unspecified 10/25/2022   Moderna Sars-Covid-2 Vaccination 03/26/2019, 04/23/2019, 11/19/2019  PNEUMOCOCCAL CONJUGATE-20 11/07/2022   Tdap 10/06/2017    Screening Tests Health Maintenance  Topic Date Due   OPHTHALMOLOGY EXAM  Never done   Diabetic kidney evaluation - Urine ACR  Never done   Hepatitis C  Screening  Never done   Lung Cancer Screening  Never done   MAMMOGRAM  Never done   Zoster Vaccines- Shingrix (1 of 2) Never done   DEXA SCAN  Never done   COVID-19 Vaccine (4 - 2024-25 season) 09/03/2022   INFLUENZA VACCINE  08/03/2023   Diabetic kidney evaluation - eGFR measurement  09/01/2023   HEMOGLOBIN A1C  10/05/2023   FOOT EXAM  01/04/2024   Medicare Annual Wellness (AWV)  08/06/2024   Fecal DNA (Cologuard)  09/14/2025   DTaP/Tdap/Td (2 - Td or Tdap) 10/07/2027   Pneumococcal Vaccine: 50+ Years  Completed   Hepatitis B Vaccines  Aged Out   HPV VACCINES  Aged Out   Meningococcal B Vaccine  Aged Out   Colonoscopy  Discontinued    Health Maintenance  Health Maintenance Due  Topic Date Due   OPHTHALMOLOGY EXAM  Never done   Diabetic kidney evaluation - Urine ACR  Never done   Hepatitis C Screening  Never done   Lung Cancer Screening  Never done   MAMMOGRAM  Never done   Zoster Vaccines- Shingrix (1 of 2) Never done   DEXA SCAN  Never done   COVID-19 Vaccine (4 - 2024-25 season) 09/03/2022   INFLUENZA VACCINE  08/03/2023   Diabetic kidney evaluation - eGFR measurement  09/01/2023   Health Maintenance Items Addressed:patient declined health maintenance  Additional Screening:  Vision Screening: Recommended annual ophthalmology exams for early detection of glaucoma and other disorders of the eye. Would you like a referral to an eye doctor? No    Dental Screening: Recommended annual dental exams for proper oral hygiene  Community Resource Referral / Chronic Care Management: CRR required this visit?  No   CCM required this visit?  No   Plan:    I have personally reviewed and noted the following in the patient's chart:   Medical and social history Use of alcohol, tobacco or illicit drugs  Current medications and supplements including opioid prescriptions. Patient is not currently taking opioid prescriptions. Functional ability and status Nutritional  status Physical activity Advanced directives List of other physicians Hospitalizations, surgeries, and ER visits in previous 12 months Vitals Screenings to include cognitive, depression, and falls Referrals and appointments  In addition, I have reviewed and discussed with patient certain preventive protocols, quality metrics, and best practice recommendations. A written personalized care plan for preventive services as well as general preventive health recommendations were provided to patient.   Lyle MARLA Right, [place]   08/07/2023   After Visit Summary: (MyChart) Due to this being a telephonic visit, the after visit summary with patients personalized plan was offered to patient via MyChart   Notes: Nothing significant to report at this time.

## 2023-09-18 ENCOUNTER — Other Ambulatory Visit: Payer: Self-pay | Admitting: Family Medicine

## 2023-09-18 MED ORDER — METFORMIN HCL 500 MG PO TABS: 500.0000 mg | ORAL_TABLET | Freq: Two times a day (BID) | ORAL | 3 refills | Status: AC

## 2023-10-03 NOTE — Progress Notes (Signed)
 Alison Chung                                          MRN: [id number]   10/03/2023   The VBCI Quality Team Specialist reviewed this patient medical record for the purposes of chart review for care gap closure. The following were reviewed: chart review for care gap closure-kidney health evaluation for diabetes:eGFR  and uACR.    VBCI Quality Team

## 2023-10-11 ENCOUNTER — Ambulatory Visit: Admitting: Family Medicine

## 2023-10-11 ENCOUNTER — Encounter: Payer: Self-pay | Admitting: Family Medicine

## 2023-10-11 VITALS — BP 132/72 | HR 66 | Temp 98.7°F | Ht 67.5 in | Wt 197.2 lb

## 2023-10-11 DIAGNOSIS — R195 Other fecal abnormalities: Secondary | ICD-10-CM

## 2023-10-11 DIAGNOSIS — E119 Type 2 diabetes mellitus without complications: Secondary | ICD-10-CM | POA: Diagnosis not present

## 2023-10-11 DIAGNOSIS — Z789 Other specified health status: Secondary | ICD-10-CM | POA: Diagnosis not present

## 2023-10-11 NOTE — Progress Notes (Signed)
 D/w pt about GI referral.  Encouraged f/u.  Rationale for GI follow-up discussed, especially given previous Cologuard result.  Diabetes:  Using medications without difficulties:yes Hypoglycemic episodes:rarely, if prolonged fasting, cautions d/w pt.   Hyperglycemic episodes:no Feet problems:no change in sensation but has h/o bunions.   Blood Sugars averaging: ~110.   eye exam within last year: yes, [facility] pending.   She is going to get flu shot at the pharmacy.   Meds, vitals, and allergies reviewed.   ROS: Per HPI unless specifically indicated in ROS section   GEN: nad, alert and oriented HEENT: ncat NECK: supple w/o LA CV: rrr. PULM: ctab, no inc wob ABD: soft, +bs EXT: no edema SKIN: Well-perfused.

## 2023-10-11 NOTE — Patient Instructions (Signed)
 Go to the lab on the way out.   If you have mychart we'll likely use that to update you.    Take care.  Glad to see you. Let me know if you need help getting an appointment at GI.

## 2023-10-12 ENCOUNTER — Telehealth: Payer: Self-pay

## 2023-10-12 LAB — CBC WITH DIFFERENTIAL/PLATELET
Basophils Absolute: 0.1 K/uL (ref 0.0–0.1)
Basophils Relative: 0.8 % (ref 0.0–3.0)
Eosinophils Absolute: 0.2 K/uL (ref 0.0–0.7)
Eosinophils Relative: 3.2 % (ref 0.0–5.0)
HCT: 34.6 % — ABNORMAL LOW (ref 36.0–46.0)
Hemoglobin: 11.6 g/dL — ABNORMAL LOW (ref 12.0–15.0)
Lymphocytes Relative: 33.5 % (ref 12.0–46.0)
Lymphs Abs: 2.3 K/uL (ref 0.7–4.0)
MCHC: 33.6 g/dL (ref 30.0–36.0)
MCV: 87.3 fl (ref 78.0–100.0)
Monocytes Absolute: 0.5 K/uL (ref 0.1–1.0)
Monocytes Relative: 7 % (ref 3.0–12.0)
Neutro Abs: 3.7 K/uL (ref 1.4–7.7)
Neutrophils Relative %: 55.5 % (ref 43.0–77.0)
Platelets: 270 K/uL (ref 150.0–400.0)
RBC: 3.96 Mil/uL (ref 3.87–5.11)
RDW: 13.5 % (ref 11.5–15.5)
WBC: 6.8 K/uL (ref 4.0–10.5)

## 2023-10-12 LAB — COMPREHENSIVE METABOLIC PANEL WITH GFR
ALT: 17 U/L (ref 0–35)
AST: 21 U/L (ref 0–37)
Albumin: 4.5 g/dL (ref 3.5–5.2)
Alkaline Phosphatase: 52 U/L (ref 39–117)
BUN: 16 mg/dL (ref 6–23)
CO2: 28 meq/L (ref 19–32)
Calcium: 9.5 mg/dL (ref 8.4–10.5)
Chloride: 105 meq/L (ref 96–112)
Creatinine, Ser: 0.81 mg/dL (ref 0.40–1.20)
GFR: 74.13 mL/min (ref >60.00)
Glucose, Bld: 85 mg/dL (ref 70–99)
Potassium: 4.4 meq/L (ref 3.5–5.1)
Sodium: 140 meq/L (ref 135–145)
Total Bilirubin: 0.5 mg/dL (ref 0.2–1.2)
Total Protein: 7.1 g/dL (ref 6.0–8.3)

## 2023-10-12 LAB — MICROALBUMIN / CREATININE URINE RATIO
Creatinine,U: 47.5 mg/dL
Microalb Creat Ratio: 23.8 mg/g (ref 0.0–30.0)
Microalb, Ur: 1.1 mg/dL (ref 0.0–1.9)

## 2023-10-12 LAB — LIPID PANEL
Cholesterol: 247 mg/dL — ABNORMAL HIGH (ref 0–200)
HDL: 50 mg/dL (ref >39.00)
LDL Cholesterol: 156 mg/dL — ABNORMAL HIGH (ref 0–99)
NonHDL: 196.92
Total CHOL/HDL Ratio: 5
Triglycerides: 206 mg/dL — ABNORMAL HIGH (ref 0.0–149.0)
VLDL: 41.2 mg/dL — ABNORMAL HIGH (ref 0.0–40.0)

## 2023-10-12 LAB — HEMOGLOBIN A1C: Hgb A1c MFr Bld: 6 % (ref 4.6–6.5)

## 2023-10-12 NOTE — Telephone Encounter (Signed)
 Health maintenance letter sent to [facility] requesting records of last eye exam.

## 2023-10-12 NOTE — Telephone Encounter (Signed)
-----   Message from Arlyss Solian sent at 10/11/2023  3:59 PM EDT ----- Please request eye exam from [facility]

## 2023-10-14 ENCOUNTER — Ambulatory Visit: Payer: Self-pay | Admitting: Family Medicine

## 2023-10-14 NOTE — Assessment & Plan Note (Signed)
 D/w pt about GI referral.  Encouraged f/u.  Rationale for GI follow-up discussed, especially given previous Cologuard result.

## 2023-10-14 NOTE — Assessment & Plan Note (Signed)
 Statin intolerant. See notes on labs.  Continue metformin . Continue work on diet and exercise.

## 2023-10-14 NOTE — Assessment & Plan Note (Signed)
 Statin intolerant

## 2023-10-15 NOTE — Telephone Encounter (Signed)
 Patient is returning call from the office. Transferred to the office.

## 2023-11-02 NOTE — Progress Notes (Signed)
 Alison Chung                                          MRN: [id number]   11/02/2023   The VBCI Quality Team Specialist reviewed this patient medical record for the purposes of chart review for care gap closure. The following were reviewed: chart review for care gap closure-diabetic eye exam.    VBCI Quality Team

## 2023-11-14 ENCOUNTER — Encounter: Payer: Self-pay | Admitting: Pharmacist

## 2023-11-14 NOTE — Progress Notes (Signed)
 Pharmacy Quality Measure Review  This patient is appearing on a report for being at risk of failing the Kidney Health Evaluation for Patients with Diabetes measure this calendar year.   Last documented UACR:  Lab Results  Component Value Date   MICRALBCREAT 23.8 10/11/2023   Last documented GFR: Lab Results  Component Value Date   GFR 74.13 10/11/2023   Measure criteria passed.  Chart forwarded  for measure closure    Pharmacy Quality Measure Review SUPD. Statin intolerance documented in 2025 though appropriate ICD 10 not used. No PCP f/u on file at this time. Next year, ensure adressing via ICD-10-CM code exceptions:  Rhabdomyolysis or myopathy G72.0           Drug-induced myopathy  G72.89         Other specified myopathies  G72.9           Myopathy, unspecified  M62.82         Rhabdomyolysis  T46.6X5A     Adverse effect of antihyperlipidemic and antiarteriosclerotic drugs

## 2023-12-06 NOTE — Progress Notes (Signed)
 RAYGEN LINQUIST                                          MRN: [id number]   12/06/2023   The VBCI Quality Team Specialist reviewed this patient medical record for the purposes of chart review for care gap closure. The following were reviewed: abstraction for care gap closure-glycemic status assessment and kidney health evaluation for diabetes:eGFR  and uACR.    VBCI Quality Team

## 2024-08-07 ENCOUNTER — Ambulatory Visit

## 2024-08-08 ENCOUNTER — Ambulatory Visit
